# Patient Record
Sex: Female | Born: 1985 | Race: White | Hispanic: No | Smoking: Never smoker
Health system: Southern US, Community
[De-identification: ages and names within clinical notes are randomized; demographics above are authoritative.]

## PROBLEM LIST (undated history)

## (undated) ENCOUNTER — Emergency Department (HOSPITAL_COMMUNITY): Admission: EM | Payer: Medicaid Other | Source: Home / Self Care

## (undated) DIAGNOSIS — R519 Headache, unspecified: Secondary | ICD-10-CM

## (undated) DIAGNOSIS — K219 Gastro-esophageal reflux disease without esophagitis: Secondary | ICD-10-CM

## (undated) DIAGNOSIS — R569 Unspecified convulsions: Secondary | ICD-10-CM

## (undated) DIAGNOSIS — F419 Anxiety disorder, unspecified: Secondary | ICD-10-CM

## (undated) DIAGNOSIS — E559 Vitamin D deficiency, unspecified: Secondary | ICD-10-CM

## (undated) HISTORY — PX: WISDOM TOOTH EXTRACTION: SHX21

## (undated) HISTORY — DX: Vitamin D deficiency, unspecified: E55.9

---

## 2016-11-09 ENCOUNTER — Emergency Department
Admission: EM | Admit: 2016-11-09 | Discharge: 2016-11-09 | Disposition: A | Discharge status: Home / Self Care | Payer: Medicaid Other | Attending: Emergency Medicine | Admitting: Emergency Medicine

## 2016-11-09 ENCOUNTER — Encounter: Payer: Self-pay | Admitting: Emergency Medicine

## 2016-11-09 ENCOUNTER — Emergency Department: Payer: Medicaid Other

## 2016-11-09 DIAGNOSIS — O2311 Infections of bladder in pregnancy, first trimester: Secondary | ICD-10-CM | POA: Insufficient documentation

## 2016-11-09 DIAGNOSIS — N3000 Acute cystitis without hematuria: Secondary | ICD-10-CM

## 2016-11-09 DIAGNOSIS — N9489 Other specified conditions associated with female genital organs and menstrual cycle: Secondary | ICD-10-CM | POA: Diagnosis not present

## 2016-11-09 DIAGNOSIS — O26891 Other specified pregnancy related conditions, first trimester: Secondary | ICD-10-CM

## 2016-11-09 DIAGNOSIS — R109 Unspecified abdominal pain: Secondary | ICD-10-CM

## 2016-11-09 DIAGNOSIS — Z3A01 Less than 8 weeks gestation of pregnancy: Secondary | ICD-10-CM | POA: Diagnosis not present

## 2016-11-09 DIAGNOSIS — R52 Pain, unspecified: Secondary | ICD-10-CM

## 2016-11-09 LAB — CBC
HCT: 40.4 % (ref 35.0–47.0)
Hemoglobin: 13.9 g/dL (ref 12.0–16.0)
MCH: 30.1 pg (ref 26.0–34.0)
MCHC: 34.5 g/dL (ref 32.0–36.0)
MCV: 87.3 fL (ref 80.0–100.0)
Platelets: 258 10*3/uL (ref 150–440)
RBC: 4.63 MIL/uL (ref 3.80–5.20)
RDW: 13.1 % (ref 11.5–14.5)
WBC: 6.5 10*3/uL (ref 3.6–11.0)

## 2016-11-09 LAB — COMPREHENSIVE METABOLIC PANEL
ALT: 37 U/L (ref 14–54)
AST: 32 U/L (ref 15–41)
Albumin: 4.7 g/dL (ref 3.5–5.0)
Alkaline Phosphatase: 49 U/L (ref 38–126)
Anion gap: 8 (ref 5–15)
BUN: 8 mg/dL (ref 6–20)
CO2: 25 mmol/L (ref 22–32)
Calcium: 9.5 mg/dL (ref 8.9–10.3)
Chloride: 103 mmol/L (ref 101–111)
Creatinine, Ser: 0.58 mg/dL (ref 0.44–1.00)
GFR calc Af Amer: >60 mL/min (ref >60)
GFR calc non Af Amer: >60 mL/min (ref >60)
Glucose, Bld: 125 mg/dL — ABNORMAL HIGH (ref 65–99)
Potassium: 3.3 mmol/L — ABNORMAL LOW (ref 3.5–5.1)
Sodium: 136 mmol/L (ref 135–145)
Total Bilirubin: 0.4 mg/dL (ref 0.3–1.2)
Total Protein: 7.7 g/dL (ref 6.5–8.1)

## 2016-11-09 LAB — URINALYSIS, COMPLETE (UACMP) WITH MICROSCOPIC
Bacteria, UA: NONE SEEN
Bilirubin Urine: NEGATIVE
Glucose, UA: NEGATIVE mg/dL
Hgb urine dipstick: NEGATIVE
Ketones, ur: 5 mg/dL — AB
Nitrite: NEGATIVE
Protein, ur: NEGATIVE mg/dL
Specific Gravity, Urine: 1.005 (ref 1.005–1.030)
pH: 6 (ref 5.0–8.0)

## 2016-11-09 LAB — ABO/RH: ABO/RH(D): AB POS

## 2016-11-09 LAB — LIPASE, BLOOD: Lipase: 28 U/L (ref 11–51)

## 2016-11-09 LAB — POCT PREGNANCY, URINE: Preg Test, Ur: POSITIVE — AB

## 2016-11-09 LAB — HCG, QUANTITATIVE, PREGNANCY: hCG, Beta Chain, Quant, S: 4822 m[IU]/mL — ABNORMAL HIGH (ref <5)

## 2016-11-09 MED ORDER — NITROFURANTOIN MONOHYD MACRO 100 MG PO CAPS: 100.0000 mg | ORAL_CAPSULE | Freq: Two times a day (BID) | ORAL | 0 refills | Status: DC

## 2016-11-09 MED ORDER — ONDANSETRON 4 MG PO TBDP: 4.0000 mg | ORAL_TABLET | Freq: Three times a day (TID) | ORAL | 0 refills | Status: DC | PRN

## 2016-11-09 NOTE — ED Notes (Signed)
Lurena JoinerRebecca in lab to add Hcg beta and ABO type to blood already in lab

## 2016-11-09 NOTE — ED Provider Notes (Signed)
[facility] Emergency Department Provider Note       Time seen: ----------------------------------------- 5:01 PM on 11/09/2016 -----------------------------------------     I have reviewed the triage vital signs and the nursing notes.   HISTORY   Chief Complaint Abdominal Pain    HPI Carolyn Lynch is a 31 y.o. female who presents to the ED for periumbilical abdominal pain. Patient states the pain started earlier today. Yesterday she had pain in the left lower side of the right lower side. She had a positive home pregnancy test a days ago, has not had OB/GYN follow-up. She denies any vaginal bleeding or leakage of fluid. Pain is currently 8 out of 10.   History reviewed. No pertinent past medical history.  There are no active problems to display for this patient.   History reviewed. No pertinent surgical history.  Allergies Patient has no known allergies.  Social History Social History  Substance Use Topics  . Smoking status: Never Smoker  . Smokeless tobacco: Never Used  . Alcohol use No    Review of Systems Constitutional: Negative for fever. Eyes: Negative for vision changes ENT:  Negative for congestion, sore throat Cardiovascular: Negative for chest pain. Respiratory: Negative for shortness of breath. Gastrointestinal: Positive for abdominal pain Genitourinary: Negative for dysuria. Musculoskeletal: Negative for back pain. Skin: Negative for rash. Neurological: Negative for headaches, focal weakness or numbness.  All systems negative/normal/unremarkable except as stated in the HPI  ____________________________________________   PHYSICAL EXAM:  VITAL SIGNS: ED Triage Vitals  Enc Vitals Group     BP 11/09/16 1620 128/73     Pulse Rate 11/09/16 1620 100     Resp 11/09/16 1620 16     Temp 11/09/16 1620 98.8 F (37.1 C)     Temp Source 11/09/16 1620 Oral     SpO2 11/09/16 1620 99 %     Weight --      Height --      Head  Circumference --      Peak Flow --      Pain Score 11/09/16 1616 8     Pain Loc --      Pain Edu? --      Excl. in GC? --     Constitutional: Alert and oriented. Well appearing and in no distress. Eyes: Conjunctivae are normal. PERRL. Normal extraocular movements. ENT   Head: Normocephalic and atraumatic.   Nose: No congestion/rhinnorhea.   Mouth/Throat: Mucous membranes are moist.   Neck: No stridor. Cardiovascular: Normal rate, regular rhythm. No murmurs, rubs, or gallops. Respiratory: Normal respiratory effort without tachypnea nor retractions. Breath sounds are clear and equal bilaterally. No wheezes/rales/rhonchi. Gastrointestinal: Soft and nontender. Normal bowel sounds Musculoskeletal: Nontender with normal range of motion in extremities. No lower extremity tenderness nor edema. Neurologic:  Normal speech and language. No gross focal neurologic deficits are appreciated.  Skin:  Skin is warm, dry and intact. No rash noted. Psychiatric: Mood and affect are normal. Speech and behavior are normal.  ____________________________________________  ED COURSE:  Pertinent labs & imaging results that were available during my care of the patient were reviewed by me and considered in my medical decision making (see chart for details). Patient presents for abdominal pain, we will assess with labs and imaging as indicated.   Procedures ____________________________________________   LABS (pertinent positives/negatives)  Labs Reviewed  COMPREHENSIVE METABOLIC PANEL - Abnormal; Notable for the following:       Result Value   Potassium 3.3 (*)  Glucose, Bld 125 (*)    All other components within normal limits  URINALYSIS, COMPLETE (UACMP) WITH MICROSCOPIC - Abnormal; Notable for the following:    Color, Urine STRAW (*)    APPearance HAZY (*)    Ketones, ur 5 (*)    Leukocytes, UA LARGE (*)    Squamous Epithelial / LPF 0-5 (*)    All other components within normal limits   POCT PREGNANCY, URINE - Abnormal; Notable for the following:    Preg Test, Ur POSITIVE (*)    All other components within normal limits  LIPASE, BLOOD  CBC  HCG, QUANTITATIVE, PREGNANCY  POC URINE PREG, ED  ABO/RH    RADIOLOGY  Pregnancy ultrasound IMPRESSION: 1. Single intrauterine gestational sac containing a yolk sac but no visible embryo at this time. Probable early intrauterine gestational sac, but no fetal pole or cardiac activity yet visualized. Recommend follow-up quantitative B-HCG levels and follow-up US in 14 days to assess viability. This recommendation follows SRU consensus guidelines: Diagnostic Criteria for Nonviable Pregnancy Early in the First Trimester. Malva Limes Engl J Med 2013; 784:6962-95; 369:1443-51. 2. Small amount of free pelvic fluid in the cul-de-sac, nonspecific. 3. Probable corpus luteum cyst of the right ovary.   ____________________________________________  FINAL ASSESSMENT AND PLAN  Abdominal pain and pregnancy  Plan: Patient's labs and imaging were dictated above. Patient had presented for abdominal pain in early pregnancy. Ultrasound is likely indicative of early intrauterine gestation. She will need follow-up ultrasound in 14 days. Unclear etiology for her mid abdominal pain. She does have evidence of UTI will be treated for same. She is stable for discharge.   Emily FilbertWilliams, Jamiria Langill E, MD   Note: This note was generated in part or whole with voice recognition software. Voice recognition is usually quite accurate but there are transcription errors that can and very often do occur. I apologize for any typographical errors that were not detected and corrected.     Emily FilbertWilliams, Samaria Anes E, MD 11/09/16 [id number] [id number]

## 2016-11-09 NOTE — ED Triage Notes (Signed)
Pt to ED c/o periumbilical abdominal pain. Pt states that the pain started earlier today. Yesterday she had pain in the left lower side and right lower side. Pt states that she had a positive home pregnancy test 8 days ago. Has not seen OBGYN. Pt denies vaginal bleeding.

## 2016-11-21 ENCOUNTER — Ambulatory Visit (INDEPENDENT_AMBULATORY_CARE_PROVIDER_SITE_OTHER): Payer: Medicaid Other | Admitting: Obstetrics and Gynecology

## 2016-11-21 ENCOUNTER — Encounter: Payer: Self-pay | Admitting: Obstetrics and Gynecology

## 2016-11-21 VITALS — BP 127/75 | HR 87 | Ht 64.75 in | Wt 182.0 lb

## 2016-11-21 DIAGNOSIS — N912 Amenorrhea, unspecified: Secondary | ICD-10-CM | POA: Diagnosis not present

## 2016-11-21 DIAGNOSIS — O2341 Unspecified infection of urinary tract in pregnancy, first trimester: Secondary | ICD-10-CM

## 2016-11-21 LAB — POCT URINALYSIS DIPSTICK
Bilirubin, UA: NEGATIVE
Blood, UA: NEGATIVE
Glucose, UA: NEGATIVE
Ketones, UA: NEGATIVE
Leukocytes, UA: NEGATIVE
Nitrite, UA: NEGATIVE
Protein, UA: NEGATIVE
Spec Grav, UA: 1.015 (ref 1.010–1.025)
Urobilinogen, UA: 0.2 E.U./dL
pH, UA: 7 (ref 5.0–8.0)

## 2016-11-21 LAB — POCT URINE PREGNANCY: Preg Test, Ur: POSITIVE — AB

## 2016-11-21 NOTE — Progress Notes (Signed)
HPI:      [name] is a 31 y.o. G1P0 who LMP was Patient's last menstrual period was 10/04/2016 (approximate).  Subjective:   She presents today With complaint of amenorrhea. She was seen in the emergency department and diagnosed with pelvic pain and urinary tract infection. At that time she had a positive principal test. An ultrasound revealed a gestational sac without fetal pole. Her abdominal pain has since resolved. She has occasional nausea without vomiting. She reports no other problems. She is not taking prenatal vitamins.    Hx: The following portions of the patient's history were reviewed and updated as appropriate:             She  has no past medical history on file. She  does not have a problem list on file. She  has no past surgical history on file. Her family history includes Cancer in her paternal grandfather and paternal grandmother; Diabetes in her mother; Hyperlipidemia in her mother; Hypertension in her father; Stroke in her mother. She  reports that she has never smoked. She has never used smokeless tobacco. She reports that she does not drink alcohol or use drugs. She has No Known Allergies.       Review of Systems:  Review of Systems  Constitutional: Denied constitutional symptoms, night sweats, recent illness, fatigue, fever, insomnia and weight loss.  Eyes: Denied eye symptoms, eye pain, photophobia, vision change and visual disturbance.  Ears/Nose/Throat/Neck: Denied ear, nose, throat or neck symptoms, hearing loss, nasal discharge, sinus congestion and sore throat.  Cardiovascular: Denied cardiovascular symptoms, arrhythmia, chest pain/pressure, edema, exercise intolerance, orthopnea and palpitations.  Respiratory: Denied pulmonary symptoms, asthma, pleuritic pain, productive sputum, cough, dyspnea and wheezing.  Gastrointestinal: Denied, gastro-esophageal reflux, melena, nausea and vomiting.  Genitourinary: Denied genitourinary symptoms including  symptomatic vaginal discharge, pelvic relaxation issues, and urinary complaints.  Musculoskeletal: Denied musculoskeletal symptoms, stiffness, swelling, muscle weakness and myalgia.  Dermatologic: Denied dermatology symptoms, rash and scar.  Neurologic: Denied neurology symptoms, dizziness, headache, neck pain and syncope.  Psychiatric: Denied psychiatric symptoms, anxiety and depression.  Endocrine: Denied endocrine symptoms including hot flashes and night sweats.   Meds:   Current Outpatient Prescriptions on File Prior to Visit  Medication Sig Dispense Refill  . nitrofurantoin, macrocrystal-monohydrate, (MACROBID) 100 MG capsule Take 1 capsule (100 mg total) by mouth 2 (two) times daily. 20 capsule 0  . ondansetron (ZOFRAN ODT) 4 MG disintegrating tablet Take 1 tablet (4 mg total) by mouth every 8 (eight) hours as needed for nausea or vomiting. 20 tablet 0   No current facility-administered medications on file prior to visit.     Objective:     Vitals:   11/21/16 0816  BP: 127/75  Pulse: 87                Assessment:    G1P0 There are no active problems to display for this patient.    1. Amenorrhea   2. UTI (urinary tract infection) during pregnancy, first trimester     Patient still taking antibiotics for UTI but symptoms dramatically improved.  Early pregnant patient with nausea but no vomiting.  Gestational sac without fetal pole.   Plan:            Prenatal Plan 1.  The patient was given prenatal literature. 2.  She was begun on prenatal vitamins. 3.  A prenatal lab panel was ordered or drawn. 4.  An ultrasound was ordered to as a follow-up to the empty  gestational sac 5.  A nurse visit was scheduled.   Orders Orders Placed This Encounter  Procedures  . US OB Follow Up  . POCT urine pregnancy  . POCT urinalysis dipstick    No orders of the defined types were placed in this encounter.       F/U  No Follow-up on file. I spent 31 minutes with this  patient of which greater than 50% was spent discussing emergency department visit, follow-up ultrasound, future prenatal care, nausea and vomiting of pregnancy.  Elonda Huskyavid J. Rana Adorno, M.D. 11/21/2016 9:37 AM

## 2016-11-29 ENCOUNTER — Ambulatory Visit (INDEPENDENT_AMBULATORY_CARE_PROVIDER_SITE_OTHER): Payer: Medicaid Other

## 2016-11-29 ENCOUNTER — Other Ambulatory Visit: Payer: Self-pay | Admitting: Obstetrics and Gynecology

## 2016-11-29 DIAGNOSIS — O2341 Unspecified infection of urinary tract in pregnancy, first trimester: Secondary | ICD-10-CM | POA: Diagnosis not present

## 2016-11-29 DIAGNOSIS — N912 Amenorrhea, unspecified: Secondary | ICD-10-CM | POA: Diagnosis not present

## 2016-11-30 ENCOUNTER — Encounter: Payer: Self-pay | Admitting: Emergency Medicine

## 2016-11-30 DIAGNOSIS — Z3A08 8 weeks gestation of pregnancy: Secondary | ICD-10-CM | POA: Diagnosis not present

## 2016-11-30 DIAGNOSIS — O209 Hemorrhage in early pregnancy, unspecified: Secondary | ICD-10-CM | POA: Insufficient documentation

## 2016-11-30 LAB — CBC WITH DIFFERENTIAL/PLATELET
Basophils Absolute: 0 10*3/uL (ref 0–0.1)
Basophils Relative: 0 %
Eosinophils Absolute: 0.1 10*3/uL (ref 0–0.7)
Eosinophils Relative: 1 %
HCT: 38.7 % (ref 35.0–47.0)
Hemoglobin: 13.5 g/dL (ref 12.0–16.0)
Lymphocytes Relative: 27 %
Lymphs Abs: 2.2 10*3/uL (ref 1.0–3.6)
MCH: 30.5 pg (ref 26.0–34.0)
MCHC: 35 g/dL (ref 32.0–36.0)
MCV: 87 fL (ref 80.0–100.0)
Monocytes Absolute: 0.5 10*3/uL (ref 0.2–0.9)
Monocytes Relative: 6 %
Neutro Abs: 5.4 10*3/uL (ref 1.4–6.5)
Neutrophils Relative %: 66 %
Platelets: 234 10*3/uL (ref 150–440)
RBC: 4.45 MIL/uL (ref 3.80–5.20)
RDW: 13.1 % (ref 11.5–14.5)
WBC: 8.3 10*3/uL (ref 3.6–11.0)

## 2016-11-30 LAB — ABO/RH: ABO/RH(D): AB POS

## 2016-11-30 LAB — HCG, QUANTITATIVE, PREGNANCY: hCG, Beta Chain, Quant, S: 142199 m[IU]/mL — ABNORMAL HIGH (ref <5)

## 2016-11-30 NOTE — ED Triage Notes (Signed)
Patient states that she is [redacted] weeks pregnant. Patient states that tonight when she went to the bathroom she had moderate amount of brown blood.

## 2016-12-01 ENCOUNTER — Emergency Department: Payer: Medicaid Other

## 2016-12-01 ENCOUNTER — Emergency Department
Admission: EM | Admit: 2016-12-01 | Discharge: 2016-12-01 | Disposition: A | Discharge status: Home / Self Care | Payer: Medicaid Other | Attending: Emergency Medicine | Admitting: Emergency Medicine

## 2016-12-01 DIAGNOSIS — O209 Hemorrhage in early pregnancy, unspecified: Secondary | ICD-10-CM

## 2016-12-01 NOTE — Discharge Instructions (Signed)
Please follow up closely with obstetrics and gynecology or your primary doctor.  Return to the emergency room if your bleeding worsens, you become weak and dizzy or lightheaded, you have an episode of passing out, develop severe bleeding such as more than 1 soaked pad per hour for more than 3 straight hours, develop abdominal or pelvic pain, fevers chills or other new concerns arise.   

## 2016-12-01 NOTE — ED Notes (Signed)
Pt discharged to home.  Family member driving.  Discharge instructions reviewed.  Verbalized understanding.  No questions or concerns at this time.  Teach back verified.  Pt in NAD.  No items left in ED.   

## 2016-12-01 NOTE — ED Provider Notes (Signed)
[facility] Emergency Department Provider Note   ____________________________________________   First MD Initiated Contact with Patient 12/01/16 [phone]     (approximate)  I have reviewed the triage vital signs and the nursing notes.   HISTORY  Chief Complaint Vaginal Bleeding   HPI Carolyn Lynch is a 31 y.o. female here for evaluation of vaginal bleeding noticed this evening  Patient was watching a movie,she started to use the bathroom and noticed what she describes as a small amount of somewhat dark blood. She only had it occur once, but reports it seems similar to the amount of blood that she would see it start of a menstrual cycle. Not associated with any pain or cramps. No nausea or vomiting. No trouble breathing. She reports that she has not had any further bleeding, and she just checked and she has not seen any evidence bleeding ongoing  She is [redacted] weeks pregnant. Follows with in [facility] no previous pregnancies   History reviewed. No pertinent past medical history.  There are no active problems to display for this patient.   History reviewed. No pertinent surgical history.  Prior to Admission medications   Medication Sig Start Date End Date Taking? Authorizing Provider  nitrofurantoin, macrocrystal-monohydrate, (MACROBID) 100 MG capsule Take 1 capsule (100 mg total) by mouth 2 (two) times daily. 11/09/16   Emily Filbert, MD  ondansetron (ZOFRAN ODT) 4 MG disintegrating tablet Take 1 tablet (4 mg total) by mouth every 8 (eight) hours as needed for nausea or vomiting. 11/09/16   Emily Filbert, MD    Allergies Patient has no known allergies.  Family History  Problem Relation Age of Onset  . Diabetes Mother   . Hyperlipidemia Mother   . Stroke Mother   . Hypertension Father   . Cancer Paternal Grandmother   . Cancer Paternal Grandfather     Social History Social History  Substance Use Topics  . Smoking status: Never  Smoker  . Smokeless tobacco: Never Used  . Alcohol use No    Review of Systems Constitutional: No fever/chills Eyes: No visual changes. ENT: No sore throat. Cardiovascular: Denies chest pain. Respiratory: Denies shortness of breath. Gastrointestinal: No abdominal pain.  No nausea, no vomiting.  No diarrhea.  No constipation. Genitourinary: Negative for dysuria.See history of present illness. No difficulty with urination or pain or burning. Musculoskeletal: Negative for back pain. Skin: Negative for rash. Neurological: Negative for headaches    ____________________________________________   PHYSICAL EXAM:  VITAL SIGNS: ED Triage Vitals  Enc Vitals Group     BP 11/30/16 2244 114/73     Pulse Rate 11/30/16 2244 87     Resp 11/30/16 2244 18     Temp 11/30/16 2244 98.3 F (36.8 C)     Temp Source 11/30/16 2244 Oral     SpO2 11/30/16 2244 97 %     Weight 11/30/16 2244 182 lb (82.6 kg)     Height 11/30/16 2244 5\' 4"  (1.626 m)     Head Circumference --      Peak Flow --      Pain Score 11/30/16 2243 0     Pain Loc --      Pain Edu? --      Excl. in GC? --     Constitutional: Alert and oriented. Well appearing and in no acute distress. Eyes: Conjunctivae are normal. Head: Atraumatic. Nose: No congestion/rhinnorhea. Mouth/Throat: Mucous membranes are moist. Neck: No stridor.   Cardiovascular: Normal rate,  regular rhythm. Grossly normal heart sounds.  Good peripheral circulation. Respiratory: Normal respiratory effort.  No retractions. Lungs CTAB. Gastrointestinal: Soft and nontender. No distention. Not palpably gravid. Musculoskeletal: No lower extremity tenderness nor edema. Neurologic:  Normal speech and language. No gross focal neurologic deficits are appreciated.  Skin:  Skin is warm, dry and intact. No rash noted. Psychiatric: Mood and affect are normal. Speech and behavior are normal.  ____________________________________________   LABS (all labs ordered are  listed, but only abnormal results are displayed)  Labs Reviewed  HCG, QUANTITATIVE, PREGNANCY - Abnormal; Notable for the following:       Result Value   hCG, Beta Chain, Quant, S 142,199 (*)    All other components within normal limits  CBC WITH DIFFERENTIAL/PLATELET  ABO/RH   ____________________________________________  EKG   ____________________________________________  RADIOLOGY  [place] Ob Comp < 14 Wks  Result Date: 12/01/2016 CLINICAL DATA:  Vaginal bleeding EXAM: OBSTETRIC <14 WK US AND TRANSVAGINAL OB US TECHNIQUE: Both transabdominal and transvaginal ultrasound examinations were performed for complete evaluation of the gestation as well as the maternal uterus, adnexal regions, and pelvic cul-de-sac. Transvaginal technique was performed to assess early pregnancy. COMPARISON:  11/29/2016 FINDINGS: Intrauterine gestational sac: Single Yolk sac:  Visualized Embryo:  Visualized Cardiac Activity: Visualized Heart Rate: 163  bpm CRL:  17.9  mm   8 w   2 d                  US EDC: 07/11/2017 Subchorionic hemorrhage:  None visualized. Maternal uterus/adnexae: Normal appearing uterus and ovaries. IMPRESSION: 1. Single live intrauterine 8 week 2 day gestation. 2. No subchorionic hemorrhage. Electronically Signed   By: Tollie Ethavid  Kwon M.D.   On: 12/01/2016 02:09   [place] Ob Transvaginal  Result Date: 12/01/2016 CLINICAL DATA:  Vaginal bleeding EXAM: OBSTETRIC <14 WK US AND TRANSVAGINAL OB US TECHNIQUE: Both transabdominal and transvaginal ultrasound examinations were performed for complete evaluation of the gestation as well as the maternal uterus, adnexal regions, and pelvic cul-de-sac. Transvaginal technique was performed to assess early pregnancy. COMPARISON:  11/29/2016 FINDINGS: Intrauterine gestational sac: Single Yolk sac:  Visualized Embryo:  Visualized Cardiac Activity: Visualized Heart Rate: 163  bpm CRL:  17.9  mm   8 w   2 d                  US EDC: 07/11/2017 Subchorionic hemorrhage:  None  visualized. Maternal uterus/adnexae: Normal appearing uterus and ovaries. IMPRESSION: 1. Single live intrauterine 8 week 2 day gestation. 2. No subchorionic hemorrhage. Electronically Signed   By: Tollie Ethavid  Kwon M.D.   On: 12/01/2016 02:09    ____________________________________________   PROCEDURES  Procedure(s) performed: None  Procedures  Critical Care performed: No  ____________________________________________   INITIAL IMPRESSION / ASSESSMENT AND PLAN / ED COURSE  Pertinent labs & imaging results that were available during my care of the patient were reviewed by me and considered in my medical decision making (see chart for details).  Painless vaginal bleeding, mild. Now apparently resolved. Positive pregnancy, previously confirmed IUP here in [facility]. Hemodynamically stable. Nontoxic. No infectious symptoms.  ----------------------------------------- 3:15 AM on 12/01/2016 -----------------------------------------  No ongoing pain or discomfort. Rh+.  Discussed with patient, careful return precautions and she will call and schedule follow-up care with [facility] on Monday.      ____________________________________________   FINAL CLINICAL IMPRESSION(S) / ED DIAGNOSES  Final diagnoses:  Vaginal bleeding in pregnancy, first trimester      NEW MEDICATIONS STARTED  DURING THIS VISIT:  New Prescriptions   No medications on file     Note:  This document was prepared using Dragon voice recognition software and may include unintentional dictation errors.     Sharyn Creamer, MD 12/01/16 [phone]

## 2016-12-06 ENCOUNTER — Ambulatory Visit (INDEPENDENT_AMBULATORY_CARE_PROVIDER_SITE_OTHER): Payer: Medicaid Other | Admitting: Obstetrics and Gynecology

## 2016-12-06 ENCOUNTER — Encounter: Payer: Self-pay | Admitting: Obstetrics and Gynecology

## 2016-12-06 VITALS — BP 112/67 | HR 88 | Ht 64.0 in | Wt 180.1 lb

## 2016-12-06 DIAGNOSIS — O200 Threatened abortion: Secondary | ICD-10-CM

## 2016-12-06 LAB — POCT URINALYSIS DIPSTICK
Bilirubin, UA: NEGATIVE
Blood, UA: NEGATIVE
Glucose, UA: NEGATIVE
Ketones, UA: NEGATIVE
Leukocytes, UA: NEGATIVE
Nitrite, UA: NEGATIVE
Protein, UA: NEGATIVE
Spec Grav, UA: 1.015 (ref 1.010–1.025)
Urobilinogen, UA: 0.2 E.U./dL
pH, UA: 5 (ref 5.0–8.0)

## 2016-12-06 NOTE — Progress Notes (Signed)
HPI:      [name] is a 31 y.o. G1P0 who LMP was Patient's last menstrual period was 10/04/2016 (approximate).  Subjective:   She presents today After being seen in emergency department for vaginal bleeding. Ultrasound at that time revealed a normal intrauterine pregnancy. There was no evidence of subchorionic hemorrhage. Her bleeding has since resolved. She has no complaints at this time.    Hx: The following portions of the patient's history were reviewed and updated as appropriate:             She  has no past medical history on file. She  does not have a problem list on file. She  has no past surgical history on file. Her family history includes Cancer in her paternal grandfather and paternal grandmother; Diabetes in her mother; Hyperlipidemia in her mother; Hypertension in her father; Stroke in her mother. She  reports that she has never smoked. She has never used smokeless tobacco. She reports that she does not drink alcohol or use drugs. She has No Known Allergies.       Review of Systems:  Review of Systems  Constitutional: Denied constitutional symptoms, night sweats, recent illness, fatigue, fever, insomnia and weight loss.  Eyes: Denied eye symptoms, eye pain, photophobia, vision change and visual disturbance.  Ears/Nose/Throat/Neck: Denied ear, nose, throat or neck symptoms, hearing loss, nasal discharge, sinus congestion and sore throat.  Cardiovascular: Denied cardiovascular symptoms, arrhythmia, chest pain/pressure, edema, exercise intolerance, orthopnea and palpitations.  Respiratory: Denied pulmonary symptoms, asthma, pleuritic pain, productive sputum, cough, dyspnea and wheezing.  Gastrointestinal: Denied, gastro-esophageal reflux, melena, nausea and vomiting.  Genitourinary: Denied genitourinary symptoms including symptomatic vaginal discharge, pelvic relaxation issues, and urinary complaints.  Musculoskeletal: Denied musculoskeletal symptoms, stiffness, swelling,  muscle weakness and myalgia.  Dermatologic: Denied dermatology symptoms, rash and scar.  Neurologic: Denied neurology symptoms, dizziness, headache, neck pain and syncope.  Psychiatric: Denied psychiatric symptoms, anxiety and depression.  Endocrine: Denied endocrine symptoms including hot flashes and night sweats.   Meds:   Current Outpatient Prescriptions on File Prior to Visit  Medication Sig Dispense Refill  . nitrofurantoin, macrocrystal-monohydrate, (MACROBID) 100 MG capsule Take 1 capsule (100 mg total) by mouth 2 (two) times daily. (Patient not taking: Reported on 12/06/2016) 20 capsule 0  . ondansetron (ZOFRAN ODT) 4 MG disintegrating tablet Take 1 tablet (4 mg total) by mouth every 8 (eight) hours as needed for nausea or vomiting. (Patient not taking: Reported on 12/06/2016) 20 tablet 0   No current facility-administered medications on file prior to visit.     Objective:     Vitals:   12/06/16 0829  BP: 112/67  Pulse: 88              Ultrasound results reviewed directly with the patient  Assessment:    G1P0    1. Threatened abortion in first trimester     No further vaginal bleeding since emergency department visit   Plan:            1.  Continue prenatal care. Nurse visit tomorrow. Follow-up new OB physical as scheduled. Orders    Meds ordered this encounter  Medications  . Prenatal Vit-Fe Fumarate-FA (PRENATAL MULTIVITAMIN) TABS tablet    Sig: Take 1 tablet by mouth daily at 12 noon.        F/U  Return for Next Scheduled Follow-up. I spent 15 minutes with this patient of which greater than 50% was spent discussing vaginal bleeding in early pregnancy, threatened  miscarriage, OB care and follow-up.  Elonda Huskyavid J. Cynethia Schindler, M.D. 12/06/2016 9:19 AM

## 2016-12-07 ENCOUNTER — Ambulatory Visit (INDEPENDENT_AMBULATORY_CARE_PROVIDER_SITE_OTHER): Payer: Medicaid Other | Admitting: Certified Nurse Midwife

## 2016-12-07 VITALS — BP 101/70 | HR 93 | Ht 64.0 in | Wt 180.3 lb

## 2016-12-07 DIAGNOSIS — Z1389 Encounter for screening for other disorder: Secondary | ICD-10-CM

## 2016-12-07 DIAGNOSIS — Z113 Encounter for screening for infections with a predominantly sexual mode of transmission: Secondary | ICD-10-CM

## 2016-12-07 DIAGNOSIS — Z3401 Encounter for supervision of normal first pregnancy, first trimester: Secondary | ICD-10-CM

## 2016-12-07 DIAGNOSIS — E669 Obesity, unspecified: Secondary | ICD-10-CM

## 2016-12-07 DIAGNOSIS — E559 Vitamin D deficiency, unspecified: Secondary | ICD-10-CM

## 2016-12-07 NOTE — Progress Notes (Signed)
Carolyn Lynch Agent presents for NOB nurse interview visit. [facility], confirmed with an ultrasound on 12/01/2016. EDD: 07/11/2016.   Saw Dr. Logan BoresEvans on 12/06/2016 for TAB. G-1.  P-0. Pregnancy education material explained and given. No cats in the home. NOB labs ordered. TSH/HbgA1c due to Increased BMI-31.  HIV labs and Drug screen were explained optional and she did not decline. Drug screen ordered. Pt states she has a Vitamin D Deficiency and used to take Vitamin D but would forget at times. Vitamin D lab ordered. Pt may try Vitamin B6 3x day and Unisom at bedtime if needed for nausea.  PNV encouraged. Genetic screening options discussed. Genetic testing: Unsure.  Pt may discuss with provider. Pt. To follow up with provider as scheduled on 01/02/2017 for NOB physical.  All questions answered. Pt stated that her mother had twins that died in utero and they had to take them.

## 2016-12-07 NOTE — Patient Instructions (Signed)
Pregnancy and Zika Virus Disease Zika virus disease, or Zika, is an illness that can spread to people from mosquitoes that carry the virus. It may also spread from person to person through infected body fluids. Zika first occurred in [place], but recently it has spread to new areas. The virus occurs in tropical climates. The location of Zika continues to change. Most people who become infected with Zika virus do not develop serious illness. However, Zika may cause birth defects in an unborn baby whose mother is infected with the virus. It may also increase the risk of miscarriage. What are the symptoms of Zika virus disease? In many cases, people who have been infected with Zika virus do not develop any symptoms. If symptoms appear, they usually start about a week after the person is infected. Symptoms are usually mild. They may include:  Fever.  Rash.  Red eyes.  Joint pain.  How does Zika virus disease spread? The main way that Zika virus spreads is through the bite of a certain type of mosquito. Unlike most types of mosquitos, which bite only at night, the type of mosquito that carries Zika virus bites both at night and during the day. Zika virus can also spread through sexual contact, through a blood transfusion, and from a mother to her baby before or during birth. Once you have had Zika virus disease, it is unlikely that you will get it again. Can I pass Zika to my baby during pregnancy? Yes, Zika can pass from a mother to her baby before or during birth. What problems can Zika cause for my baby? A woman who is infected with Zika virus while pregnant is at risk of having her baby born with a condition in which the brain or head is smaller than expected (microcephaly). Babies who have microcephaly can have developmental delays, seizures, hearing problems, and vision problems. Having Zika virus disease during pregnancy can also increase the risk of miscarriage. How can Zika virus disease be  prevented? There is no vaccine to prevent Zika. The best way to prevent the disease is to avoid infected mosquitoes and avoid exposure to body fluids that can spread the virus. Avoid any possible exposure to Zika by taking the following precautions. For women and their sex partners:  Avoid traveling to high-risk areas. The locations where Zika is being reported change often. To identify high-risk areas, check the CDC travel website: www.cdc.gov/zika/geo/index.html  If you or your sex partner must travel to a high-risk area, talk with a health care provider before and after traveling.  Take all precautions to avoid mosquito bites if you live in, or travel to, any of the high-risk areas. Insect repellents are safe to use during pregnancy.  Ask your health care provider when it is safe to have sexual contact.  For women:  If you are pregnant or trying to become pregnant, avoid sexual contact with persons who may have been exposed to Zika virus, persons who have possible symptoms of Zika, or persons whose history you are unsure about. If you choose to have sexual contact with someone who may have been exposed to Zika virus, use condoms correctly during the entire duration of sexual activity, every time. Do not share sexual devices, as you may be exposed to body fluids.  Ask your health care provider about when it is safe to attempt pregnancy after a possible exposure to Zika virus.  What steps should I take to avoid mosquito bites? Take these steps to avoid mosquito bites   when you are in a high-risk area:  Wear loose clothing that covers your arms and legs.  Limit your outdoor activities.  Do not open windows unless they have window screens.  Sleep under mosquito nets.  Use insect repellent. The best insect repellents have:  DEET, picaridin, oil of lemon eucalyptus (OLE), or IR3535 in them.  Higher amounts of an active ingredient in them.  Remember that insect repellents are safe to  use during pregnancy.  Do not use OLE on children who are younger than 3 years of age. Do not use insect repellent on babies who are younger than 2 months of age.  Cover your child's stroller with mosquito netting. Make sure the netting fits snugly and that any loose netting does not cover your child's mouth or nose. Do not use a blanket as a mosquito-protection cover.  Do not apply insect repellent underneath clothing.  If you are using sunscreen, apply the sunscreen before applying the insect repellent.  Treat clothing with permethrin. Do not apply permethrin directly to your skin. Follow label directions for safe use.  Get rid of standing water, where mosquitoes may reproduce. Standing water is often found in items such as buckets, bowls, animal food dishes, and flowerpots.  When you return from traveling to any high-risk area, continue taking actions to protect yourself against mosquito bites for 3 weeks, even if you show no signs of illness. This will prevent spreading Zika virus to uninfected mosquitoes. What should I know about the sexual transmission of Zika? People can spread Zika to their sexual partners during vaginal, anal, or oral sex, or by sharing sexual devices. Many people with Zika do not develop symptoms, so a person could spread the disease without knowing that they are infected. The greatest risk is to women who are pregnant or who may become pregnant. Zika virus can live longer in semen than it can live in blood. Couples can prevent sexual transmission of the virus by:  Using condoms correctly during the entire duration of sexual activity, every time. This includes vaginal, anal, and oral sex.  Not sharing sexual devices. Sharing increases your risk of being exposed to body fluid from another person.  Avoiding all sexual activity until your health care provider says it is safe.  Should I be tested for Zika virus? A sample of your blood can be tested for Zika virus. A  pregnant woman should be tested if she may have been exposed to the virus or if she has symptoms of Zika. She may also have additional tests done during her pregnancy, such ultrasound testing. Talk with your health care provider about which tests are recommended. This information is not intended to replace advice given to you by your health care provider. Make sure you discuss any questions you have with your health care provider. Document Released: 03/02/2015 Document Revised: 11/17/2015 Document Reviewed: 02/23/2015 Elsevier Interactive Patient Education  2018 [employer] Hyperemesis Gravidarum Hyperemesis gravidarum is a severe form of nausea and vomiting that happens during pregnancy. Hyperemesis is worse than morning sickness. It may cause you to have nausea or vomiting all day for many days. It may keep you from eating and drinking enough food and liquids. Hyperemesis usually occurs during the first half (the first 20 weeks) of pregnancy. It often goes away once a woman is in her second half of pregnancy. However, sometimes hyperemesis continues through an entire pregnancy. What are the causes? The cause of this condition is not known. It may be related   to changes in chemicals (hormones) in the body during pregnancy, such as the high level of pregnancy hormone (human chorionic gonadotropin) or the increase in the female sex hormone (estrogen). What are the signs or symptoms? Symptoms of this condition include:  Severe nausea and vomiting.  Nausea that does not go away.  Vomiting that does not allow you to keep any food down.  Weight loss.  Body fluid loss (dehydration).  Having no desire to eat, or not liking food that you have previously enjoyed.  How is this diagnosed? This condition may be diagnosed based on:  A physical exam.  Your medical history.  Your symptoms.  Blood tests.  Urine tests.  How is this treated? This condition may be managed with medicine. If  medicines to do not help relieve nausea and vomiting, you may need to receive fluids through an IV tube at the hospital. Follow these instructions at home:  Take over-the-counter and prescription medicines only as told by your health care provider.  Avoid iron pills and multivitamins that contain iron for the first 3-4 months of pregnancy. If you take prescription iron pills, do not stop taking them unless your health care provider approves.  Take the following actions to help prevent nausea and vomiting: ? In the morning, before getting out of bed, try eating a couple of dry crackers or a piece of toast. ? Avoid foods and smells that upset your stomach. Fatty and spicy foods may make nausea worse. ? Eat 5-6 small meals a day. ? Do not drink fluids while eating meals. Drink between meals. ? Eat or suck on things that have ginger in them. Ginger can help relieve nausea. ? Avoid food preparation. The smell of food can spoil your appetite or trigger nausea.  Follow instructions from your health care provider about eating or drinking restrictions.  For snacks, eat high-protein foods, such as cheese.  Keep all follow-up and pre-birth (prenatal) visits as told by your health care provider. This is important. Contact a health care provider if:  You have pain in your abdomen.  You have a severe headache.  You have vision problems.  You are losing weight. Get help right away if:  You cannot drink fluids without vomiting.  You vomit blood.  You have constant nausea and vomiting.  You are very weak.  You are very thirsty.  You feel dizzy.  You faint.  You have a fever or other symptoms that last for more than 2-3 days.  You have a fever and your symptoms suddenly get worse. Summary  Hyperemesis gravidarum is a severe form of nausea and vomiting that happens during pregnancy.  Making some changes to your eating habits may help relieve nausea and vomiting.  This condition may  be managed with medicine.  If medicines to do not help relieve nausea and vomiting, you may need to receive fluids through an IV tube at the hospital. This information is not intended to replace advice given to you by your health care provider. Make sure you discuss any questions you have with your health care provider. Document Released: 06/11/2005 Document Revised: 02/08/2016 Document Reviewed: 02/08/2016 Elsevier Interactive Patient Education  2017 [employer] First Trimester of Pregnancy The first trimester of pregnancy is from week 1 until the end of week 13 (months 1 through 3). During this time, your baby will begin to develop inside you. At 6-8 weeks, the eyes and face are formed, and the heartbeat can be seen on ultrasound. At the   end of 12 weeks, all the baby's organs are formed. Prenatal care is all the medical care you receive before the birth of your baby. Make sure you get good prenatal care and follow all of your doctor's instructions. Follow these instructions at home: Medicines  Take over-the-counter and prescription medicines only as told by your doctor. Some medicines are safe and some medicines are not safe during pregnancy.  Take a prenatal vitamin that contains at least 600 micrograms (mcg) of folic acid.  If you have trouble pooping (constipation), take medicine that will make your stool soft (stool softener) if your doctor approves. Eating and drinking  Eat regular, healthy meals.  Your doctor will tell you the amount of weight gain that is right for you.  Avoid raw meat and uncooked cheese.  If you feel sick to your stomach (nauseous) or throw up (vomit): ? Eat 4 or 5 small meals a day instead of 3 large meals. ? Try eating a few soda crackers. ? Drink liquids between meals instead of during meals.  To prevent constipation: ? Eat foods that are high in fiber, like fresh fruits and vegetables, whole grains, and beans. ? Drink enough fluids to keep your pee  (urine) clear or pale yellow. Activity  Exercise only as told by your doctor. Stop exercising if you have cramps or pain in your lower belly (abdomen) or low back.  Do not exercise if it is too hot, too humid, or if you are in a place of great height (high altitude).  Try to avoid standing for long periods of time. Move your legs often if you must stand in one place for a long time.  Avoid heavy lifting.  Wear low-heeled shoes. Sit and stand up straight.  You can have sex unless your doctor tells you not to. Relieving pain and discomfort  Wear a good support bra if your breasts are sore.  Take warm water baths (sitz baths) to soothe pain or discomfort caused by hemorrhoids. Use hemorrhoid cream if your doctor says it is okay.  Rest with your legs raised if you have leg cramps or low back pain.  If you have puffy, bulging veins (varicose veins) in your legs: ? Wear support hose or compression stockings as told by your doctor. ? Raise (elevate) your feet for 15 minutes, 3-4 times a day. ? Limit salt in your food. Prenatal care  Schedule your prenatal visits by the twelfth week of pregnancy.  Write down your questions. Take them to your prenatal visits.  Keep all your prenatal visits as told by your doctor. This is important. Safety  Wear your seat belt at all times when driving.  Make a list of emergency phone numbers. The list should include numbers for family, friends, the hospital, and police and fire departments. General instructions  Ask your doctor for a referral to a local prenatal class. Begin classes no later than at the start of month 6 of your pregnancy.  Ask for help if you need counseling or if you need help with nutrition. Your doctor can give you advice or tell you where to go for help.  Do not use hot tubs, steam rooms, or saunas.  Do not douche or use tampons or scented sanitary pads.  Do not cross your legs for long periods of time.  Avoid all herbs  and alcohol. Avoid drugs that are not approved by your doctor.  Do not use any tobacco products, including cigarettes, chewing tobacco, and electronic cigarettes.   If you need help quitting, ask your doctor. You may get counseling or other support to help you quit.  Avoid cat litter boxes and soil used by cats. These carry germs that can cause birth defects in the baby and can cause a loss of your baby (miscarriage) or stillbirth.  Visit your dentist. At home, brush your teeth with a soft toothbrush. Be gentle when you floss. Contact a doctor if:  You are dizzy.  You have mild cramps or pressure in your lower belly.  You have a nagging pain in your belly area.  You continue to feel sick to your stomach, you throw up, or you have watery poop (diarrhea).  You have a bad smelling fluid coming from your vagina.  You have pain when you pee (urinate).  You have increased puffiness (swelling) in your face, hands, legs, or ankles. Get help right away if:  You have a fever.  You are leaking fluid from your vagina.  You have spotting or bleeding from your vagina.  You have very bad belly cramping or pain.  You gain or lose weight rapidly.  You throw up blood. It may look like coffee grounds.  You are around people who have German measles, fifth disease, or chickenpox.  You have a very bad headache.  You have shortness of breath.  You have any kind of trauma, such as from a fall or a car accident. Summary  The first trimester of pregnancy is from week 1 until the end of week 13 (months 1 through 3).  To take care of yourself and your unborn baby, you will need to eat healthy meals, take medicines only if your doctor tells you to do so, and do activities that are safe for you and your baby.  Keep all follow-up visits as told by your doctor. This is important as your doctor will have to ensure that your baby is healthy and growing well. This information is not intended to replace  advice given to you by your health care provider. Make sure you discuss any questions you have with your health care provider. Document Released: 11/28/2007 Document Revised: 06/19/2016 Document Reviewed: 06/19/2016 Elsevier Interactive Patient Education  2017 [employer] Commonly Asked Questions During Pregnancy  Cats: A parasite can be excreted in cat feces.  To avoid exposure you need to have another person empty the little box.  If you must empty the litter box you will need to wear gloves.  Wash your hands after handling your cat.  This parasite can also be found in raw or undercooked meat so this should also be avoided.  Colds, Sore Throats, Flu: Please check your medication sheet to see what you can take for symptoms.  If your symptoms are unrelieved by these medications please call the office.  Dental Work: Most any dental work your dentist recommends is permitted.  X-rays should only be taken during the first trimester if absolutely necessary.  Your abdomen should be shielded with a lead apron during all x-rays.  Please notify your provider prior to receiving any x-rays.  Novocaine is fine; gas is not recommended.  If your dentist requires a note from us prior to dental work please call the office and we will provide one for you.  Exercise: Exercise is an important part of staying healthy during your pregnancy.  You may continue most exercises you were accustomed to prior to pregnancy.  Later in your pregnancy you will most likely notice you have difficulty with activities   requiring balance like riding a bicycle.  It is important that you listen to your body and avoid activities that put you at a higher risk of falling.  Adequate rest and staying well hydrated are a must!  If you have questions about the safety of specific activities ask your provider.    Exposure to Children with illness: Try to avoid obvious exposure; report any symptoms to us when noted,  If you have chicken pos, red  measles or mumps, you should be immune to these diseases.   Please do not take any vaccines while pregnant unless you have checked with your OB provider.  Fetal Movement: After 28 weeks we recommend you do "kick counts" twice daily.  Lie or sit down in a calm quiet environment and count your baby movements "kicks".  You should feel your baby at least 10 times per hour.  If you have not felt 10 kicks within the first hour get up, walk around and have something sweet to eat or drink then repeat for an additional hour.  If count remains less than 10 per hour notify your provider.  Fumigating: Follow your pest control agent's advice as to how long to stay out of your home.  Ventilate the area well before re-entering.  Hemorrhoids:   Most over-the-counter preparations can be used during pregnancy.  Check your medication to see what is safe to use.  It is important to use a stool softener or fiber in your diet and to drink lots of liquids.  If hemorrhoids seem to be getting worse please call the office.   Hot Tubs:  Hot tubs Jacuzzis and saunas are not recommended while pregnant.  These increase your internal body temperature and should be avoided.  Intercourse:  Sexual intercourse is safe during pregnancy as long as you are comfortable, unless otherwise advised by your provider.  Spotting may occur after intercourse; report any bright red bleeding that is heavier than spotting.  Labor:  If you know that you are in labor, please go to the hospital.  If you are unsure, please call the office and let us help you decide what to do.  Lifting, straining, etc:  If your job requires heavy lifting or straining please check with your provider for any limitations.  Generally, you should not lift items heavier than that you can lift simply with your hands and arms (no back muscles)  Painting:  Paint fumes do not harm your pregnancy, but may make you ill and should be avoided if possible.  Latex or water based paints  have less odor than oils.  Use adequate ventilation while painting.  Permanents & Hair Color:  Chemicals in hair dyes are not recommended as they cause increase hair dryness which can increase hair loss during pregnancy.  " Highlighting" and permanents are allowed.  Dye may be absorbed differently and permanents may not hold as well during pregnancy.  Sunbathing:  Use a sunscreen, as skin burns easily during pregnancy.  Drink plenty of fluids; avoid over heating.  Tanning Beds:  Because their possible side effects are still unknown, tanning beds are not recommended.  Ultrasound Scans:  Routine ultrasounds are performed at approximately 20 weeks.  You will be able to see your baby's general anatomy an if you would like to know the gender this can usually be determined as well.  If it is questionable when you conceived you may also receive an ultrasound early in your pregnancy for dating purposes.  Otherwise ultrasound exams   are not routinely performed unless there is a medical necessity.  Although you can request a scan we ask that you pay for it when conducted because insurance does not cover " patient request" scans.  Work: If your pregnancy proceeds without complications you may work until your due date, unless your physician or employer advises otherwise.  Round Ligament Pain/Pelvic Discomfort:  Sharp, shooting pains not associated with bleeding are fairly common, usually occurring in the second trimester of pregnancy.  They tend to be worse when standing up or when you remain standing for long periods of time.  These are the result of pressure of certain pelvic ligaments called "round ligaments".  Rest, Tylenol and heat seem to be the most effective relief.  As the womb and fetus grow, they rise out of the pelvis and the discomfort improves.  Please notify the office if your pain seems different than that described.  It may represent a more serious condition.   

## 2016-12-07 NOTE — Progress Notes (Signed)
I have reviewed the record and concur with patient management and plan.    Jenkins Michelle Lawhorn, CNM [facility] 

## 2016-12-09 LAB — GC/CHLAMYDIA PROBE AMP
Chlamydia trachomatis, NAA: NEGATIVE
Neisseria gonorrhoeae by PCR: NEGATIVE

## 2016-12-09 LAB — URINE CULTURE, OB REFLEX: Organism ID, Bacteria: NO GROWTH

## 2016-12-09 LAB — CULTURE, OB URINE

## 2016-12-10 LAB — URINALYSIS, ROUTINE W REFLEX MICROSCOPIC
Bilirubin, UA: NEGATIVE
Glucose, UA: NEGATIVE
Nitrite, UA: NEGATIVE
Protein, UA: NEGATIVE
RBC, UA: NEGATIVE
Specific Gravity, UA: >=1.03 — AB (ref 1.005–1.030)
Urobilinogen, Ur: 1 mg/dL (ref 0.2–1.0)
pH, UA: 6.5 (ref 5.0–7.5)

## 2016-12-10 LAB — MONITOR DRUG PROFILE 14(MW)
Amphetamine Scrn, Ur: NEGATIVE ng/mL
BARBITURATE SCREEN URINE: NEGATIVE ng/mL
BENZODIAZEPINE SCREEN, URINE: NEGATIVE ng/mL
Buprenorphine, Urine: NEGATIVE ng/mL
CANNABINOIDS UR QL SCN: NEGATIVE ng/mL
Cocaine (Metab) Scrn, Ur: NEGATIVE ng/mL
Creatinine(Crt), U: 204.6 mg/dL (ref 20.0–300.0)
Fentanyl, Urine: NEGATIVE pg/mL
Meperidine Screen, Urine: NEGATIVE ng/mL
Methadone Screen, Urine: NEGATIVE ng/mL
OXYCODONE+OXYMORPHONE UR QL SCN: NEGATIVE ng/mL
Opiate Scrn, Ur: NEGATIVE ng/mL
Ph of Urine: 6.1 (ref 4.5–8.9)
Phencyclidine Qn, Ur: NEGATIVE ng/mL
Propoxyphene Scrn, Ur: NEGATIVE ng/mL
SPECIFIC GRAVITY: 1.027
Tramadol Screen, Urine: NEGATIVE ng/mL

## 2016-12-10 LAB — MICROSCOPIC EXAMINATION: Casts: NONE SEEN /lpf

## 2016-12-10 LAB — NICOTINE SCREEN, URINE: Cotinine Ql Scrn, Ur: NEGATIVE ng/mL

## 2016-12-11 LAB — CBC WITH DIFFERENTIAL/PLATELET
Basophils Absolute: 0 10*3/uL (ref 0.0–0.2)
Basos: 0 %
EOS (ABSOLUTE): 0.1 10*3/uL (ref 0.0–0.4)
Eos: 1 %
Hematocrit: 39.3 % (ref 34.0–46.6)
Hemoglobin: 13.4 g/dL (ref 11.1–15.9)
Immature Grans (Abs): 0 10*3/uL (ref 0.0–0.1)
Immature Granulocytes: 0 %
Lymphocytes Absolute: 1.8 10*3/uL (ref 0.7–3.1)
Lymphs: 25 %
MCH: 29.5 pg (ref 26.6–33.0)
MCHC: 34.1 g/dL (ref 31.5–35.7)
MCV: 87 fL (ref 79–97)
Monocytes Absolute: 0.6 10*3/uL (ref 0.1–0.9)
Monocytes: 8 %
Neutrophils Absolute: 4.9 10*3/uL (ref 1.4–7.0)
Neutrophils: 66 %
Platelets: 239 10*3/uL (ref 150–379)
RBC: 4.54 x10E6/uL (ref 3.77–5.28)
RDW: 14.1 % (ref 12.3–15.4)
WBC: 7.4 10*3/uL (ref 3.4–10.8)

## 2016-12-11 LAB — ABO

## 2016-12-11 LAB — RUBELLA SCREEN: Rubella Antibodies, IGG: 1.08 index (ref >0.99)

## 2016-12-11 LAB — HEMOGLOBIN A1C
Est. average glucose Bld gHb Est-mCnc: 100 mg/dL
Hgb A1c MFr Bld: 5.1 % (ref 4.8–5.6)

## 2016-12-11 LAB — ANTIBODY SCREEN: Antibody Screen: NEGATIVE

## 2016-12-11 LAB — HIV ANTIBODY (ROUTINE TESTING W REFLEX): HIV Screen 4th Generation wRfx: NONREACTIVE

## 2016-12-11 LAB — RH TYPE: Rh Factor: POSITIVE

## 2016-12-11 LAB — VARICELLA ZOSTER ANTIBODY, IGG: Varicella zoster IgG: 812 index (ref >165)

## 2016-12-11 LAB — VITAMIN D 1,25 DIHYDROXY
Vitamin D 1, 25 (OH)2 Total: 95 pg/mL — ABNORMAL HIGH
Vitamin D2 1, 25 (OH)2: 16 pg/mL
Vitamin D3 1, 25 (OH)2: 79 pg/mL

## 2016-12-11 LAB — HEPATITIS B SURFACE ANTIGEN: Hepatitis B Surface Ag: NEGATIVE

## 2016-12-11 LAB — TSH: TSH: 1.56 u[IU]/mL (ref 0.450–4.500)

## 2016-12-11 LAB — RPR: RPR Ser Ql: NONREACTIVE

## 2017-01-02 ENCOUNTER — Encounter: Payer: Self-pay | Admitting: Obstetrics and Gynecology

## 2017-01-02 ENCOUNTER — Ambulatory Visit (INDEPENDENT_AMBULATORY_CARE_PROVIDER_SITE_OTHER): Payer: Medicaid Other | Admitting: Obstetrics and Gynecology

## 2017-01-02 VITALS — BP 102/68 | HR 88 | Wt 178.0 lb

## 2017-01-02 DIAGNOSIS — B372 Candidiasis of skin and nail: Secondary | ICD-10-CM

## 2017-01-02 DIAGNOSIS — Z3401 Encounter for supervision of normal first pregnancy, first trimester: Secondary | ICD-10-CM | POA: Diagnosis not present

## 2017-01-02 DIAGNOSIS — Z3491 Encounter for supervision of normal pregnancy, unspecified, first trimester: Secondary | ICD-10-CM

## 2017-01-02 LAB — POCT URINALYSIS DIPSTICK
Bilirubin, UA: NEGATIVE
Blood, UA: NEGATIVE
Glucose, UA: NEGATIVE
Leukocytes, UA: NEGATIVE
Nitrite, UA: NEGATIVE
Protein, UA: NEGATIVE
Spec Grav, UA: 1.02 (ref 1.010–1.025)
Urobilinogen, UA: 0.2 E.U./dL
pH, UA: 5 (ref 5.0–8.0)

## 2017-01-02 NOTE — Progress Notes (Signed)
NOB:  Occ N/V - discussed hydration and increased caloric intake.  Pt vomiting 3X per week.  Desires [id number] - draw today.  AFP next visit.  Cutaneous yeast under breasts - discussed anti-fungal cream.

## 2017-01-02 NOTE — Progress Notes (Signed)
Physical examination General NAD, Conversant  HEENT Atraumatic; Op clear with mmm.  Normo-cephalic. Pupils reactive. Anicteric sclerae  Thyroid/Neck Smooth without nodularity or enlargement. Normal ROM.  Neck Supple.  Skin No rashes, lesions or ulceration. Normal palpated skin turgor. No nodularity.  Breasts: No masses or discharge.  Symmetric.  No axillary adenopathy.  Lungs: Clear to auscultation.No rales or wheezes. Normal Respiratory effort, no retractions.  Heart: NSR.  No murmurs or rubs appreciated. No periferal edema  Abdomen: Soft.  Non-tender.  No masses.  No HSM. No hernia  Extremities: Moves all appropriately.  Normal ROM for age. No lymphadenopathy.  Neuro: Oriented to PPT.  Normal mood. Normal affect.     Pelvic:   Vulva: Normal appearance.  No lesions.  Vagina: No lesions or abnormalities noted.  Support: Normal pelvic support.  Urethra No masses tenderness or scarring.  Meatus Normal size without lesions or prolapse.  Cervix: Normal appearance.  No lesions.  Anus: Normal exam.  No lesions.  Perineum: Normal exam.  No lesions.        Bimanual   Adnexae: No masses.  Non-tender to palpation.  Uterus: Enlarged. 13wks  Non-tender.  Mobile.  AV.  Adnexae: No masses.  Non-tender to palpation.  Cul-de-sac: Negative for abnormality.  Adnexae: No masses.  Non-tender to palpation.         Pelvimetry   Diagonal: Reached.  Spines: Average.  Sacrum: Concave.  Pubic Arch: Normal.

## 2017-01-05 ENCOUNTER — Encounter: Payer: Self-pay | Admitting: Obstetrics and Gynecology

## 2017-01-09 LAB — MATERNIT 21 PLUS CORE, BLOOD
Chromosome 13: NEGATIVE
Chromosome 18: NEGATIVE
Chromosome 21: NEGATIVE
Y Chromosome: NOT DETECTED

## 2017-01-10 ENCOUNTER — Telehealth: Payer: Self-pay | Admitting: Obstetrics and Gynecology

## 2017-01-10 NOTE — Telephone Encounter (Signed)
Please call with the genetic test results

## 2017-01-10 NOTE — Telephone Encounter (Signed)
Pt calls back, informed her of negative genetic results and female sex. PT gave verbal understanding.

## 2017-01-10 NOTE — Telephone Encounter (Signed)
Called number listed, no answer. Unable to leave message as no voicemail is set up.

## 2017-01-15 LAB — PAP IG, CT-NG, RFX HPV ASCU
Chlamydia, Nuc. Acid Amp: NEGATIVE
Gonococcus by Nucleic Acid Amp: NEGATIVE
PAP Smear Comment: 0

## 2017-01-15 LAB — HPV DNA PROBE HIGH RISK, AMPLIFIED: HPV, high-risk: NEGATIVE

## 2017-01-16 ENCOUNTER — Telehealth: Payer: Self-pay

## 2017-01-16 NOTE — Telephone Encounter (Signed)
mychart message sent

## 2017-01-16 NOTE — Telephone Encounter (Signed)
-----   Message from Linzie Collinavid James Evans, MD sent at 01/10/2017 11:15 AM EDT ----- Fetal Screening test is negative for abnormalities.

## 2017-01-17 ENCOUNTER — Telehealth: Payer: Self-pay | Admitting: Obstetrics and Gynecology

## 2017-01-17 NOTE — Telephone Encounter (Signed)
Spoke with pt to let her know the testing she had done was negative/normal.

## 2017-01-17 NOTE — Telephone Encounter (Signed)
Please call with results  Patient is having issues with her my chart app

## 2017-01-18 ENCOUNTER — Encounter: Payer: Self-pay | Admitting: Obstetrics and Gynecology

## 2017-01-29 ENCOUNTER — Encounter: Payer: Self-pay | Admitting: Obstetrics and Gynecology

## 2017-01-30 ENCOUNTER — Ambulatory Visit (INDEPENDENT_AMBULATORY_CARE_PROVIDER_SITE_OTHER): Payer: Medicaid Other | Admitting: Obstetrics and Gynecology

## 2017-01-30 VITALS — BP 113/67 | HR 93 | Wt 178.1 lb

## 2017-01-30 DIAGNOSIS — Z3402 Encounter for supervision of normal first pregnancy, second trimester: Secondary | ICD-10-CM

## 2017-01-30 LAB — POCT URINALYSIS DIPSTICK
Bilirubin, UA: NEGATIVE
Blood, UA: NEGATIVE
Glucose, UA: NEGATIVE
Ketones, UA: NEGATIVE
Nitrite, UA: NEGATIVE
Protein, UA: NEGATIVE
Spec Grav, UA: 1.01 (ref 1.010–1.025)
Urobilinogen, UA: 0.2 E.U./dL
pH, UA: 7.5 (ref 5.0–8.0)

## 2017-01-30 NOTE — Progress Notes (Signed)
ROB: Notes n/v has improved.  Normal [id number]. Will need anatomy scan next visit.  RTC in 4 weeks.

## 2017-01-30 NOTE — Patient Instructions (Signed)

## 2017-02-27 ENCOUNTER — Ambulatory Visit (INDEPENDENT_AMBULATORY_CARE_PROVIDER_SITE_OTHER): Payer: Medicaid Other

## 2017-02-27 ENCOUNTER — Encounter: Payer: Self-pay | Admitting: Obstetrics and Gynecology

## 2017-02-27 ENCOUNTER — Ambulatory Visit (INDEPENDENT_AMBULATORY_CARE_PROVIDER_SITE_OTHER): Payer: Medicaid Other | Admitting: Obstetrics and Gynecology

## 2017-02-27 VITALS — BP 115/71 | HR 94 | Wt 184.1 lb

## 2017-02-27 DIAGNOSIS — Z3402 Encounter for supervision of normal first pregnancy, second trimester: Secondary | ICD-10-CM

## 2017-02-27 LAB — POCT URINALYSIS DIPSTICK
Bilirubin, UA: NEGATIVE
Blood, UA: NEGATIVE
Glucose, UA: NEGATIVE
Ketones, UA: NEGATIVE
Leukocytes, UA: NEGATIVE
Nitrite, UA: NEGATIVE
Protein, UA: NEGATIVE
Spec Grav, UA: 1.025 (ref 1.010–1.025)
Urobilinogen, UA: 0.2 E.U./dL
pH, UA: 5 (ref 5.0–8.0)

## 2017-02-27 NOTE — Progress Notes (Signed)
ROB:  Anatomy scan today.  No complaints.

## 2017-03-27 ENCOUNTER — Ambulatory Visit (INDEPENDENT_AMBULATORY_CARE_PROVIDER_SITE_OTHER): Payer: Medicaid Other | Admitting: Obstetrics and Gynecology

## 2017-03-27 VITALS — BP 98/63 | HR 84 | Wt 192.1 lb

## 2017-03-27 DIAGNOSIS — O2242 Hemorrhoids in pregnancy, second trimester: Secondary | ICD-10-CM

## 2017-03-27 DIAGNOSIS — Z3403 Encounter for supervision of normal first pregnancy, third trimester: Secondary | ICD-10-CM | POA: Insufficient documentation

## 2017-03-27 DIAGNOSIS — Z131 Encounter for screening for diabetes mellitus: Secondary | ICD-10-CM

## 2017-03-27 DIAGNOSIS — Z130 Encounter for screening for diseases of the blood and blood-forming organs and certain disorders involving the immune mechanism: Secondary | ICD-10-CM

## 2017-03-27 DIAGNOSIS — Z3402 Encounter for supervision of normal first pregnancy, second trimester: Secondary | ICD-10-CM

## 2017-03-27 LAB — POCT URINALYSIS DIPSTICK
Bilirubin, UA: NEGATIVE
Blood, UA: NEGATIVE
Glucose, UA: NEGATIVE
Ketones, UA: NEGATIVE
Leukocytes, UA: NEGATIVE
Nitrite, UA: NEGATIVE
Spec Grav, UA: 1.01 (ref 1.010–1.025)
Urobilinogen, UA: 0.2 E.U./dL
pH, UA: 7.5 (ref 5.0–8.0)

## 2017-03-27 NOTE — Progress Notes (Signed)
Pt is here for a routine OB visit. Is undecided about Flu shot.

## 2017-03-27 NOTE — Progress Notes (Signed)
ROB: Notes hemorrhoids, but using Preparation-H is helping.  General questions regarding pregnancy answered. RTC in 4 weeks. For 28 week labs then. Declines flu vaccine.

## 2017-04-24 ENCOUNTER — Ambulatory Visit (INDEPENDENT_AMBULATORY_CARE_PROVIDER_SITE_OTHER): Payer: Medicaid Other | Admitting: Obstetrics and Gynecology

## 2017-04-24 ENCOUNTER — Other Ambulatory Visit: Payer: Medicaid Other

## 2017-04-24 VITALS — BP 100/66 | HR 99 | Wt 199.1 lb

## 2017-04-24 DIAGNOSIS — Z131 Encounter for screening for diabetes mellitus: Secondary | ICD-10-CM

## 2017-04-24 DIAGNOSIS — Z3403 Encounter for supervision of normal first pregnancy, third trimester: Secondary | ICD-10-CM

## 2017-04-24 DIAGNOSIS — Z130 Encounter for screening for diseases of the blood and blood-forming organs and certain disorders involving the immune mechanism: Secondary | ICD-10-CM

## 2017-04-24 LAB — POCT URINALYSIS DIPSTICK
Bilirubin, UA: NEGATIVE
Blood, UA: NEGATIVE
Glucose, UA: NEGATIVE
Ketones, UA: NEGATIVE
Leukocytes, UA: NEGATIVE
Nitrite, UA: NEGATIVE
Protein, UA: NEGATIVE
Spec Grav, UA: 1.02 (ref 1.010–1.025)
Urobilinogen, UA: 0.2 E.U./dL
pH, UA: 7 (ref 5.0–8.0)

## 2017-04-24 NOTE — Progress Notes (Signed)
ROB: She has no complaints.  She completed her 1 hour GCT today.  She declines flu shot.

## 2017-04-25 LAB — CBC
Hematocrit: 34.3 % (ref 34.0–46.6)
Hemoglobin: 11.2 g/dL (ref 11.1–15.9)
MCH: 29.8 pg (ref 26.6–33.0)
MCHC: 32.7 g/dL (ref 31.5–35.7)
MCV: 91 fL (ref 79–97)
Platelets: 182 10*3/uL (ref 150–379)
RBC: 3.76 x10E6/uL — ABNORMAL LOW (ref 3.77–5.28)
RDW: 14.2 % (ref 12.3–15.4)
WBC: 5.7 10*3/uL (ref 3.4–10.8)

## 2017-04-25 LAB — GLUCOSE, 1 HOUR GESTATIONAL: Gestational Diabetes Screen: 132 mg/dL (ref 65–139)

## 2017-05-08 ENCOUNTER — Encounter: Payer: Self-pay | Admitting: Obstetrics and Gynecology

## 2017-05-08 ENCOUNTER — Encounter: Payer: Medicaid Other | Admitting: Obstetrics and Gynecology

## 2017-05-08 ENCOUNTER — Ambulatory Visit (INDEPENDENT_AMBULATORY_CARE_PROVIDER_SITE_OTHER): Payer: Medicaid Other | Admitting: Obstetrics and Gynecology

## 2017-05-08 VITALS — BP 106/68 | HR 101 | Wt 203.8 lb

## 2017-05-08 DIAGNOSIS — O368130 Decreased fetal movements, third trimester, not applicable or unspecified: Secondary | ICD-10-CM

## 2017-05-08 DIAGNOSIS — Z3403 Encounter for supervision of normal first pregnancy, third trimester: Secondary | ICD-10-CM | POA: Diagnosis not present

## 2017-05-08 NOTE — Patient Instructions (Addendum)
Tdap Vaccine (Tetanus, Diphtheria and Pertussis): What You Need to Know 1. Why get vaccinated? Tetanus, diphtheria and pertussis are very serious diseases. Tdap vaccine can protect us from these diseases. And, Tdap vaccine given to pregnant women can protect newborn babies against pertussis. TETANUS (Lockjaw) is rare in the [place] today. It causes painful muscle tightening and stiffness, usually all over the body.  It can lead to tightening of muscles in the head and neck so you can't open your mouth, swallow, or sometimes even breathe. Tetanus kills about 1 out of 10 people who are infected even after receiving the best medical care.  DIPHTHERIA is also rare in the [place] today. It can cause a thick coating to form in the back of the throat.  It can lead to breathing problems, heart failure, paralysis, and death.  PERTUSSIS (Whooping Cough) causes severe coughing spells, which can cause difficulty breathing, vomiting and disturbed sleep.  It can also lead to weight loss, incontinence, and rib fractures. Up to 2 in 100 adolescents and 5 in 100 adults with pertussis are hospitalized or have complications, which could include pneumonia or death.  These diseases are caused by bacteria. Diphtheria and pertussis are spread from person to person through secretions from coughing or sneezing. Tetanus enters the body through cuts, scratches, or wounds. Before vaccines, as many as 200,000 cases of diphtheria, 200,000 cases of pertussis, and hundreds of cases of tetanus, were reported in the [place] each year. Since vaccination began, reports of cases for tetanus and diphtheria have dropped by about 99% and for pertussis by about 80%. 2. Tdap vaccine Tdap vaccine can protect adolescents and adults from tetanus, diphtheria, and pertussis. One dose of Tdap is routinely given at age 11 or 12. People who did not get Tdap at that age should get it as soon as possible. Tdap is especially  important for healthcare professionals and anyone having close contact with a baby younger than 12 months. Pregnant women should get a dose of Tdap during every pregnancy, to protect the newborn from pertussis. Infants are most at risk for severe, life-threatening complications from pertussis. Another vaccine, called Td, protects against tetanus and diphtheria, but not pertussis. A Td booster should be given every 10 years. Tdap may be given as one of these boosters if you have never gotten Tdap before. Tdap may also be given after a severe cut or burn to prevent tetanus infection. Your doctor or the person giving you the vaccine can give you more information. Tdap may safely be given at the same time as other vaccines. 3. Some people should not get this vaccine  A person who has ever had a life-threatening allergic reaction after a previous dose of any diphtheria, tetanus or pertussis containing vaccine, OR has a severe allergy to any part of this vaccine, should not get Tdap vaccine. Tell the person giving the vaccine about any severe allergies.  Anyone who had coma or long repeated seizures within 7 days after a childhood dose of DTP or DTaP, or a previous dose of Tdap, should not get Tdap, unless a cause other than the vaccine was found. They can still get Td.  Talk to your doctor if you: ? have seizures or another nervous system problem, ? had severe pain or swelling after any vaccine containing diphtheria, tetanus or pertussis, ? ever had a condition called Guillain-Barr Syndrome (GBS), ? aren't feeling well on the day the shot is scheduled. 4. Risks With any medicine, including   vaccines, there is a chance of side effects. These are usually mild and go away on their own. Serious reactions are also possible but are rare. Most people who get Tdap vaccine do not have any problems with it. Mild problems following Tdap: (Did not interfere with activities)  Pain where the shot was given (about  3 in 4 adolescents or 2 in 3 adults)  Redness or swelling where the shot was given (about 1 person in 5)  Mild fever of at least 100.4F (up to about 1 in 25 adolescents or 1 in 100 adults)  Headache (about 3 or 4 people in 10)  Tiredness (about 1 person in 3 or 4)  Nausea, vomiting, diarrhea, stomach ache (up to 1 in 4 adolescents or 1 in 10 adults)  Chills, sore joints (about 1 person in 10)  Body aches (about 1 person in 3 or 4)  Rash, swollen glands (uncommon)  Moderate problems following Tdap: (Interfered with activities, but did not require medical attention)  Pain where the shot was given (up to 1 in 5 or 6)  Redness or swelling where the shot was given (up to about 1 in 16 adolescents or 1 in 12 adults)  Fever over 102F (about 1 in 100 adolescents or 1 in 250 adults)  Headache (about 1 in 7 adolescents or 1 in 10 adults)  Nausea, vomiting, diarrhea, stomach ache (up to 1 or 3 people in 100)  Swelling of the entire arm where the shot was given (up to about 1 in 500).  Severe problems following Tdap: (Unable to perform usual activities; required medical attention)  Swelling, severe pain, bleeding and redness in the arm where the shot was given (rare).  Problems that could happen after any vaccine:  People sometimes faint after a medical procedure, including vaccination. Sitting or lying down for about 15 minutes can help prevent fainting, and injuries caused by a fall. Tell your doctor if you feel dizzy, or have vision changes or ringing in the ears.  Some people get severe pain in the shoulder and have difficulty moving the arm where a shot was given. This happens very rarely.  Any medication can cause a severe allergic reaction. Such reactions from a vaccine are very rare, estimated at fewer than 1 in a million doses, and would happen within a few minutes to a few hours after the vaccination. As with any medicine, there is a very remote chance of a vaccine  causing a serious injury or death. The safety of vaccines is always being monitored. For more information, visit: www.cdc.gov/vaccinesafety/ 5. What if there is a serious problem? What should I look for? Look for anything that concerns you, such as signs of a severe allergic reaction, very high fever, or unusual behavior. Signs of a severe allergic reaction can include hives, swelling of the face and throat, difficulty breathing, a fast heartbeat, dizziness, and weakness. These would usually start a few minutes to a few hours after the vaccination. What should I do?  If you think it is a severe allergic reaction or other emergency that can't wait, call 9-1-1 or get the person to the nearest hospital. Otherwise, call your doctor.  Afterward, the reaction should be reported to the Vaccine Adverse Event Reporting System (VAERS). Your doctor might file this report, or you can do it yourself through the VAERS web site at www.vaers.hhs.gov, or by calling 1-800-822-7967. ? VAERS does not give medical advice. 6. The National Vaccine Injury Compensation Program The National   Vaccine Injury Compensation Program (VICP) is a federal program that was created to compensate people who may have been injured by certain vaccines. Persons who believe they may have been injured by a vaccine can learn about the program and about filing a claim by calling 1-631-062-5579 or visiting the VICP website at SpiritualWord.atwww.hrsa.gov/vaccinecompensation. There is a time limit to file a claim for compensation. 7. How can I learn more?  Ask your doctor. He or she can give you the vaccine package insert or suggest other sources of information.  Call your local or state health department.  Contact the Centers for Disease Control and Prevention (CDC): ? Call (585)483-89081-216-551-7111 (1-800-CDC-INFO) or ? Visit CDC's website at PicCapture.uywww.cdc.gov/vaccines CDC Tdap Vaccine VIS (08/18/13) This information is not intended to replace advice given to you by your  health care provider. Make sure you discuss any questions you have with your health care provider. Document Released: 12/11/2011 Document Revised: 03/01/2016 Document Reviewed: 03/01/2016 Elsevier Interactive Patient Education  2017 Elsevier Inc.    Fetal Movement Counts Patient Name: ________________________________________________ Patient Due Date: ____________________ What is a fetal movement count? A fetal movement count is the number of times that you feel your baby move during a certain amount of time. This may also be called a fetal kick count. A fetal movement count is recommended for every pregnant woman. You may be asked to start counting fetal movements as early as week 28 of your pregnancy. Pay attention to when your baby is most active. You may notice your baby's sleep and wake cycles. You may also notice things that make your baby move more. You should do a fetal movement count:  When your baby is normally most active.  At the same time each day.  A good time to count movements is while you are resting, after having something to eat and drink. How do I count fetal movements? 1. Find a quiet, comfortable area. Sit, or lie down on your side. 2. Write down the date, the start time and stop time, and the number of movements that you felt between those two times. Take this information with you to your health care visits. 3. For 2 hours, count kicks, flutters, swishes, rolls, and jabs. You should feel at least 10 movements during 2 hours. 4. You may stop counting after you have felt 10 movements. 5. If you do not feel 10 movements in 2 hours, have something to eat and drink. Then, keep resting and counting for 1 hour. If you feel at least 4 movements during that hour, you may stop counting. Contact a health care provider if:  You feel fewer than 4 movements in 2 hours.  Your baby is not moving like he or she usually does. Date: ____________ Start time: ____________ Stop time:  ____________ Movements: ____________ Date: ____________ Start time: ____________ Stop time: ____________ Movements: ____________ Date: ____________ Start time: ____________ Stop time: ____________ Movements: ____________ Date: ____________ Start time: ____________ Stop time: ____________ Movements: ____________ Date: ____________ Start time: ____________ Stop time: ____________ Movements: ____________ Date: ____________ Start time: ____________ Stop time: ____________ Movements: ____________ Date: ____________ Start time: ____________ Stop time: ____________ Movements: ____________ Date: ____________ Start time: ____________ Stop time: ____________ Movements: ____________ Date: ____________ Start time: ____________ Stop time: ____________ Movements: ____________ This information is not intended to replace advice given to you by your health care provider. Make sure you discuss any questions you have with your health care provider. Document Released: 07/11/2006 Document Revised: 02/08/2016 Document Reviewed: 07/21/2015 Elsevier Interactive  Patient Education  2018 Elsevier Inc.  

## 2017-05-08 NOTE — Progress Notes (Signed)
ROB: Patient reports of falling up steps on her knees, but the box she was holding fell into her stomach  Occurring at 11:15 this morning.  Denies pain currently. Also reports decreased fetal movement all day today, even prior to fall. Unsure about receiving Tdap, given information to review.   NST performed today was reviewed and was found to be reactive.  Continue recommended antenatal testing and prenatal care.   NONSTRESS TEST INTERPRETATION  INDICATIONS: Decreased fetal movement  FHR baseline: 120 bpm RESULTS:Reactive (with use of juice and acoustic stimulator) COMMENTS: Period of saltatory conduction for first 5 minutes of tracing but resolved   PLAN: 1. Continue fetal kick counts twice a day. 2.Continue routine prenatal care

## 2017-05-08 NOTE — Progress Notes (Signed)
ROB- Pt states she fell up steps and fell on her knees and the box that she was holding fell into her stomach, Denies pain, would like to discuss tdap vaccine first

## 2017-05-22 ENCOUNTER — Encounter: Payer: Self-pay | Admitting: Obstetrics and Gynecology

## 2017-05-22 ENCOUNTER — Ambulatory Visit (INDEPENDENT_AMBULATORY_CARE_PROVIDER_SITE_OTHER): Payer: Medicaid Other | Admitting: Obstetrics and Gynecology

## 2017-05-22 VITALS — BP 121/66 | HR 108 | Wt 204.5 lb

## 2017-05-22 DIAGNOSIS — Z3403 Encounter for supervision of normal first pregnancy, third trimester: Secondary | ICD-10-CM

## 2017-05-22 LAB — POCT URINALYSIS DIPSTICK
Bilirubin, UA: NEGATIVE
Blood, UA: NEGATIVE
Glucose, UA: NEGATIVE
Leukocytes, UA: NEGATIVE
Nitrite, UA: NEGATIVE
Protein, UA: NEGATIVE
Spec Grav, UA: 1.025 (ref 1.010–1.025)
Urobilinogen, UA: 0.2 E.U./dL
pH, UA: 5 (ref 5.0–8.0)

## 2017-05-22 NOTE — Progress Notes (Signed)
ROB:  Pt doing well.  No N/V.  Size > dates - consider U/S if stays ahead.

## 2017-06-01 ENCOUNTER — Observation Stay
Admission: EM | Admit: 2017-06-01 | Discharge: 2017-06-01 | Disposition: A | Discharge status: Home / Self Care | Payer: Medicaid Other | Attending: Obstetrics and Gynecology | Admitting: Obstetrics and Gynecology

## 2017-06-01 ENCOUNTER — Other Ambulatory Visit: Payer: Self-pay

## 2017-06-01 ENCOUNTER — Encounter: Payer: Self-pay | Admitting: Emergency Medicine

## 2017-06-01 DIAGNOSIS — Z3A34 34 weeks gestation of pregnancy: Secondary | ICD-10-CM | POA: Diagnosis not present

## 2017-06-01 DIAGNOSIS — O2693 Pregnancy related conditions, unspecified, third trimester: Secondary | ICD-10-CM | POA: Diagnosis not present
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
## 2017-06-01 NOTE — ED Notes (Signed)
Pt to go to L&D - placed on otf
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
## 2017-06-05 ENCOUNTER — Encounter: Payer: Self-pay | Admitting: Obstetrics and Gynecology

## 2017-06-05 ENCOUNTER — Ambulatory Visit (INDEPENDENT_AMBULATORY_CARE_PROVIDER_SITE_OTHER): Payer: Medicaid Other | Admitting: Obstetrics and Gynecology

## 2017-06-05 VITALS — BP 122/74 | HR 96 | Wt 208.3 lb

## 2017-06-05 DIAGNOSIS — Z23 Encounter for immunization: Secondary | ICD-10-CM | POA: Diagnosis not present

## 2017-06-05 DIAGNOSIS — Z3403 Encounter for supervision of normal first pregnancy, third trimester: Secondary | ICD-10-CM | POA: Diagnosis not present

## 2017-06-05 DIAGNOSIS — N949 Unspecified condition associated with female genital organs and menstrual cycle: Secondary | ICD-10-CM

## 2017-06-05 LAB — POCT URINALYSIS DIPSTICK
Bilirubin, UA: NEGATIVE
Blood, UA: NEGATIVE
Glucose, UA: NEGATIVE
Ketones, UA: NEGATIVE
Nitrite, UA: NEGATIVE
Protein, UA: NEGATIVE
Spec Grav, UA: 1.01 (ref 1.010–1.025)
Urobilinogen, UA: 0.2 E.U./dL
pH, UA: 7 (ref 5.0–8.0)

## 2017-06-05 NOTE — Progress Notes (Signed)
ROB- Pt has been having pelvic pain

## 2017-06-05 NOTE — Progress Notes (Signed)
ROB: Patient c/o right sided groin pain.  Unsure if she "pulled something".  Discussed stretching, heat/ice application.  For Tdap today.  RTC in 2 weeks.

## 2017-06-20 ENCOUNTER — Encounter: Payer: Self-pay | Admitting: Obstetrics and Gynecology

## 2017-06-20 ENCOUNTER — Encounter: Payer: Medicaid Other | Admitting: Obstetrics and Gynecology

## 2017-06-24 ENCOUNTER — Ambulatory Visit (INDEPENDENT_AMBULATORY_CARE_PROVIDER_SITE_OTHER): Payer: Medicaid Other | Admitting: Obstetrics and Gynecology

## 2017-06-24 ENCOUNTER — Encounter: Payer: Self-pay | Admitting: Obstetrics and Gynecology

## 2017-06-24 VITALS — BP 126/78 | HR 102 | Wt 212.5 lb

## 2017-06-24 DIAGNOSIS — Z3403 Encounter for supervision of normal first pregnancy, third trimester: Secondary | ICD-10-CM

## 2017-06-24 LAB — POCT URINALYSIS DIPSTICK
Bilirubin, UA: NEGATIVE
Blood, UA: NEGATIVE
Glucose, UA: NEGATIVE
Leukocytes, UA: NEGATIVE
Nitrite, UA: NEGATIVE
Protein, UA: NEGATIVE
Spec Grav, UA: 1.015 (ref 1.010–1.025)
Urobilinogen, UA: 0.2 E.U./dL
pH, UA: 7 (ref 5.0–8.0)

## 2017-06-24 NOTE — Progress Notes (Signed)
ROB: GC/CT-GBS performed.  S&S discussed.  Patient considering primary cesarean delivery for family scheduling concerns.  I have discouraged this and discussed the possibility of scheduling induction instead.  Patient seems adamant regarding cesarean.  She will think about it for a few days and then inform us of her decision.

## 2017-06-25 NOTE — L&D Delivery Note (Signed)
       Delivery Note   Carolyn Georgeda Pischke is a 32 y.o. G1P0 at 3282w1d Estimated Date of Delivery: 07/11/17  PRE-OPERATIVE DIAGNOSIS:  1) 3382w1d pregnancy.   POST-OPERATIVE DIAGNOSIS:  1) 5982w1d pregnancy s/p induced vaginal delivery  Delivery Type: Vaginal   Delivery Anesthesia: Epidural   Labor Complications:       ESTIMATED BLOOD LOSS: 150  ml    FINDINGS:   1) female infant, Apgar scores of 8    at 1 minute and 9    at 5 minutes and a birthweight of 128.4  ounces.    2) Nuchal cord: No  SPECIMENS:   PLACENTA:   Appearance: Intact    Removal: Spontaneous      Disposition:     DISPOSITION:  Infant to left in stable condition in the delivery room, with L&D personnel and mother,  NARRATIVE SUMMARY: Labor course:  [name] is a G1P0 at 4982w1d who presented for induction of labor.  She progressed well in labor with misoprostol.  She received the appropriate anesthesia and proceeded to complete dilation. She evidenced good maternal expulsive effort during the second stage. She went on to deliver a viable infant. The placenta delivered without problems and was noted to be complete. A perineal and vaginal examination was performed. Episiotomy/Lacerations: 1st degree  Episiotomy or lacerations were repaired with Vicryl suture using local anesthesia. The patient tolerated this well.  Elonda Huskyavid J. Sharnetta Gielow, M.D. 07/12/2017 5:21 PM

## 2017-06-26 ENCOUNTER — Telehealth: Payer: Self-pay | Admitting: Obstetrics and Gynecology

## 2017-06-26 ENCOUNTER — Telehealth: Payer: Self-pay

## 2017-06-26 LAB — STREP GP B NAA: Strep Gp B NAA: NEGATIVE

## 2017-06-26 LAB — GC/CHLAMYDIA PROBE AMP
Chlamydia trachomatis, NAA: NEGATIVE
Neisseria gonorrhoeae by PCR: NEGATIVE

## 2017-06-26 NOTE — Telephone Encounter (Signed)
Spoke with pt- she decided she doesn't want a c/s. Provider informed.

## 2017-06-26 NOTE — Telephone Encounter (Signed)
The patient called and stated that she needs to speak with Jasmine DecemberSharon, No other information was disclosed. Please advise.

## 2017-07-02 ENCOUNTER — Encounter: Payer: Self-pay | Admitting: Obstetrics and Gynecology

## 2017-07-02 ENCOUNTER — Ambulatory Visit (INDEPENDENT_AMBULATORY_CARE_PROVIDER_SITE_OTHER): Payer: Medicaid Other | Admitting: Obstetrics and Gynecology

## 2017-07-02 VITALS — BP 115/67 | HR 85 | Wt 213.3 lb

## 2017-07-02 DIAGNOSIS — Z3403 Encounter for supervision of normal first pregnancy, third trimester: Secondary | ICD-10-CM

## 2017-07-02 NOTE — Progress Notes (Signed)
ROB: Patient notes complaints of worsening bouts of strong contractions that last for 30 minutes and then resolve. Further discussed IOL vs primary C-section (as patient requested last visit).  Patient desires to schedule delivery due to family circumstances ([place]), she does not have anyone else to care for her son, so will need family to come in. Cervix today unfavorable. Discussed IOL at due date. Patient no longer desires to schedule C-section. Will schedule for 07/12/16. Patient also desires to discontinue working at this time. Will bring FMLA papers tomorrow. RTc in 1 week.

## 2017-07-02 NOTE — Patient Instructions (Addendum)
Labor Induction Labor induction is when steps are taken to cause a pregnant woman to begin the labor process. Most women go into labor on their own between 37 weeks and 42 weeks of the pregnancy. When this does not happen or when there is a medical need, methods may be used to induce labor. Labor induction causes a pregnant woman's uterus to contract. It also causes the cervix to soften (ripen), open (dilate), and thin out (efface). Usually, labor is not induced before 39 weeks of the pregnancy unless there is a problem with the baby or mother. Before inducing labor, your health care provider will consider a number of factors, including the following:  The medical condition of you and the baby.  How many weeks along you are.  The status of the baby's lung maturity.  The condition of the cervix.  The position of the baby. What are the reasons for labor induction? Labor may be induced for the following reasons:  The health of the baby or mother is at risk.  The pregnancy is overdue by 1 week or more.  The water breaks but labor does not start on its own.  The mother has a health condition or serious illness, such as high blood pressure, infection, placental abruption, or diabetes.  The amniotic fluid amounts are low around the baby.  The baby is distressed. Convenience or wanting the baby to be born on a certain date is not a reason for inducing labor. What methods are used for labor induction? Several methods of labor induction may be used, such as:  Prostaglandin medicine. This medicine causes the cervix to dilate and ripen. The medicine will also start contractions. It can be taken by mouth or by inserting a suppository into the vagina.  Inserting a thin tube (catheter) with a balloon on the end into the vagina to dilate the cervix. Once inserted, the balloon is expanded with water, which causes the cervix to open.  Stripping the membranes. Your health care provider separates  amniotic sac tissue from the cervix, causing the cervix to be stretched and causing the release of a hormone called progesterone. This may cause the uterus to contract. It is often done during an office visit. You will be sent home to wait for the contractions to begin. You will then come in for an induction.  Breaking the water. Your health care provider makes a hole in the amniotic sac using a small instrument. Once the amniotic sac breaks, contractions should begin. This may still take hours to see an effect.  Medicine to trigger or strengthen contractions. This medicine is given through an IV access tube inserted into a vein in your arm. All of the methods of induction, besides stripping the membranes, will be done in the hospital. Induction is done in the hospital so that you and the baby can be carefully monitored. How long does it take for labor to be induced? Some inductions can take up to 2-3 days. Depending on the cervix, it usually takes less time. It takes longer when you are induced early in the pregnancy or if this is your first pregnancy. If a mother is still pregnant and the induction has been going on for 2-3 days, either the mother will be sent home or a cesarean delivery will be needed. What are the risks associated with labor induction? Some of the risks of induction include:  Changes in fetal heart rate, such as too high, too low, or erratic.  Fetal distress.    Chance of infection for the mother and baby.  Increased chance of having a cesarean delivery.  Breaking off (abruption) of the placenta from the uterus (rare).  Uterine rupture (very rare). When induction is needed for medical reasons, the benefits of induction may outweigh the risks. What are some reasons for not inducing labor? Labor induction should not be done if:  It is shown that your baby does not tolerate labor.  You have had previous surgeries on your uterus, such as a myomectomy or the removal of  fibroids.  Your placenta lies very low in the uterus and blocks the opening of the cervix (placenta previa).  Your baby is not in a head-down position.  The umbilical cord drops down into the birth canal in front of the baby. This could cut off the baby's blood and oxygen supply.  You have had a previous cesarean delivery.  There are unusual circumstances, such as the baby being extremely premature. This information is not intended to replace advice given to you by your health care provider. Make sure you discuss any questions you have with your health care provider. Document Released: 10/31/2006 Document Revised: 11/17/2015 Document Reviewed: 01/08/2013 Elsevier Interactive Patient Education  2017 [employer]  

## 2017-07-10 ENCOUNTER — Encounter: Payer: Self-pay | Admitting: Obstetrics and Gynecology

## 2017-07-10 ENCOUNTER — Ambulatory Visit (INDEPENDENT_AMBULATORY_CARE_PROVIDER_SITE_OTHER): Payer: Medicaid Other | Admitting: Obstetrics and Gynecology

## 2017-07-10 ENCOUNTER — Encounter: Payer: Medicaid Other | Admitting: Obstetrics and Gynecology

## 2017-07-10 VITALS — BP 129/81 | HR 101 | Wt 213.0 lb

## 2017-07-10 DIAGNOSIS — Z3403 Encounter for supervision of normal first pregnancy, third trimester: Secondary | ICD-10-CM

## 2017-07-10 LAB — POCT URINALYSIS DIPSTICK
Bilirubin, UA: NEGATIVE
Blood, UA: NEGATIVE
Glucose, UA: NEGATIVE
Ketones, UA: NEGATIVE
Leukocytes, UA: NEGATIVE
Nitrite, UA: NEGATIVE
Spec Grav, UA: 1.02 (ref 1.010–1.025)
Urobilinogen, UA: 0.2 E.U./dL
pH, UA: 7 (ref 5.0–8.0)

## 2017-07-10 NOTE — Progress Notes (Signed)
ROB: Notes spotting yesterday that has subsided. Scheduled for elective IOL on 07/12/17.

## 2017-07-10 NOTE — Progress Notes (Signed)
ROB- Pt states she was spotting yesterday but has subsided

## 2017-07-12 ENCOUNTER — Other Ambulatory Visit: Payer: Self-pay

## 2017-07-12 ENCOUNTER — Inpatient Hospital Stay: Payer: Medicaid Other | Admitting: Certified Registered Nurse Anesthetist

## 2017-07-12 ENCOUNTER — Inpatient Hospital Stay
Admission: EM | Admit: 2017-07-12 | Discharge: 2017-07-13 | DRG: 806 | Disposition: A | Discharge status: Home / Self Care | Payer: Medicaid Other | Source: Ambulatory Visit | Attending: Obstetrics and Gynecology | Admitting: Obstetrics and Gynecology

## 2017-07-12 DIAGNOSIS — Z3A40 40 weeks gestation of pregnancy: Secondary | ICD-10-CM

## 2017-07-12 DIAGNOSIS — O2243 Hemorrhoids in pregnancy, third trimester: Secondary | ICD-10-CM | POA: Diagnosis present

## 2017-07-12 DIAGNOSIS — Z3490 Encounter for supervision of normal pregnancy, unspecified, unspecified trimester: Secondary | ICD-10-CM

## 2017-07-12 DIAGNOSIS — O480 Post-term pregnancy: Principal | ICD-10-CM | POA: Diagnosis present

## 2017-07-12 LAB — CBC
HCT: 39.3 % (ref 35.0–47.0)
Hemoglobin: 13.3 g/dL (ref 12.0–16.0)
MCH: 30 pg (ref 26.0–34.0)
MCHC: 34 g/dL (ref 32.0–36.0)
MCV: 88.2 fL (ref 80.0–100.0)
Platelets: 171 10*3/uL (ref 150–440)
RBC: 4.45 MIL/uL (ref 3.80–5.20)
RDW: 14.1 % (ref 11.5–14.5)
WBC: 5.8 10*3/uL (ref 3.6–11.0)

## 2017-07-12 LAB — RAPID HIV SCREEN (HIV 1/2 AB+AG)
HIV 1/2 Antibodies: NONREACTIVE
HIV-1 P24 Antigen - HIV24: NONREACTIVE

## 2017-07-12 LAB — TYPE AND SCREEN
ABO/RH(D): AB POS
Antibody Screen: NEGATIVE

## 2017-07-12 MED ORDER — TERBUTALINE SULFATE 1 MG/ML IJ SOLN: 0.2500 mg | Freq: Once | INTRAMUSCULAR | Status: DC | PRN

## 2017-07-12 MED ORDER — OXYTOCIN 40 UNITS IN LACTATED RINGERS INFUSION - SIMPLE MED
2.5000 [IU]/h | INTRAVENOUS | Status: DC | PRN
  Filled 2017-07-12: qty 1000

## 2017-07-12 MED ORDER — ONDANSETRON HCL 4 MG/2ML IJ SOLN
4.0000 mg | Freq: Four times a day (QID) | INTRAMUSCULAR | Status: DC | PRN
  Administered 2017-07-12: 4 mg via INTRAVENOUS
  Filled 2017-07-12: qty 2

## 2017-07-12 MED ORDER — IBUPROFEN 600 MG PO TABS
600.0000 mg | ORAL_TABLET | Freq: Four times a day (QID) | ORAL | Status: DC
Start: 2017-07-13 — End: 2017-07-14
  Administered 2017-07-13 (×3): 600 mg via ORAL
  Filled 2017-07-12 (×3): qty 1

## 2017-07-12 MED ORDER — FENTANYL 2.5 MCG/ML W/ROPIVACAINE 0.15% IN NS 100 ML EPIDURAL (ARMC)
EPIDURAL | Status: DC | PRN
  Administered 2017-07-12: 12 mL/h via EPIDURAL

## 2017-07-12 MED ORDER — TETANUS-DIPHTH-ACELL PERTUSSIS 5-2.5-18.5 LF-MCG/0.5 IM SUSP: 0.5000 mL | Freq: Once | INTRAMUSCULAR | Status: DC

## 2017-07-12 MED ORDER — BUPIVACAINE HCL (PF) 0.25 % IJ SOLN
INTRAMUSCULAR | Status: DC | PRN
  Administered 2017-07-12 (×2): 4 mL via EPIDURAL

## 2017-07-12 MED ORDER — LACTATED RINGERS IV SOLN
INTRAVENOUS | Status: DC
  Administered 2017-07-12 (×2): via INTRAVENOUS

## 2017-07-12 MED ORDER — ACETAMINOPHEN 325 MG PO TABS: 650.0000 mg | ORAL_TABLET | ORAL | Status: DC | PRN

## 2017-07-12 MED ORDER — DIPHENHYDRAMINE HCL 25 MG PO CAPS: 25.0000 mg | ORAL_CAPSULE | Freq: Four times a day (QID) | ORAL | Status: DC | PRN

## 2017-07-12 MED ORDER — LACTATED RINGERS IV SOLN: 500.0000 mL | INTRAVENOUS | Status: DC | PRN

## 2017-07-12 MED ORDER — LACTATED RINGERS IV SOLN: INTRAVENOUS | Status: DC

## 2017-07-12 MED ORDER — ZOLPIDEM TARTRATE 5 MG PO TABS: 5.0000 mg | ORAL_TABLET | Freq: Every evening | ORAL | Status: DC | PRN

## 2017-07-12 MED ORDER — FENTANYL 2.5 MCG/ML W/ROPIVACAINE 0.15% IN NS 100 ML EPIDURAL (ARMC)
EPIDURAL | Status: AC
  Filled 2017-07-12: qty 100

## 2017-07-12 MED ORDER — OXYCODONE-ACETAMINOPHEN 5-325 MG PO TABS: 1.0000 | ORAL_TABLET | ORAL | Status: DC | PRN

## 2017-07-12 MED ORDER — BENZOCAINE-MENTHOL 20-0.5 % EX AERO
1.0000 "application " | INHALATION_SPRAY | CUTANEOUS | Status: DC | PRN
  Administered 2017-07-12: 1 via TOPICAL
  Filled 2017-07-12: qty 56

## 2017-07-12 MED ORDER — IBUPROFEN 600 MG PO TABS
600.0000 mg | ORAL_TABLET | Freq: Four times a day (QID) | ORAL | Status: DC
  Administered 2017-07-12: 600 mg via ORAL
  Filled 2017-07-12: qty 1

## 2017-07-12 MED ORDER — OXYTOCIN BOLUS FROM INFUSION
500.0000 mL | Freq: Once | INTRAVENOUS | Status: AC
  Administered 2017-07-12: 500 mL via INTRAVENOUS

## 2017-07-12 MED ORDER — PRENATAL MULTIVITAMIN CH
1.0000 | ORAL_TABLET | Freq: Every day | ORAL | Status: DC
  Administered 2017-07-13: 1 via ORAL

## 2017-07-12 MED ORDER — LIDOCAINE HCL (PF) 1 % IJ SOLN: 30.0000 mL | INTRAMUSCULAR | Status: DC | PRN

## 2017-07-12 MED ORDER — SOD CITRATE-CITRIC ACID 500-334 MG/5ML PO SOLN: 30.0000 mL | ORAL | Status: DC | PRN

## 2017-07-12 MED ORDER — SIMETHICONE 80 MG PO CHEW: 80.0000 mg | CHEWABLE_TABLET | ORAL | Status: DC | PRN

## 2017-07-12 MED ORDER — MISOPROSTOL 50MCG HALF TABLET
ORAL_TABLET | ORAL | Status: AC
  Administered 2017-07-12: 50 ug via VAGINAL
  Filled 2017-07-12: qty 1

## 2017-07-12 MED ORDER — LIDOCAINE HCL (PF) 1 % IJ SOLN
INTRAMUSCULAR | Status: DC | PRN
  Administered 2017-07-12: 3 mL

## 2017-07-12 MED ORDER — MISOPROSTOL 25 MCG QUARTER TABLET
50.0000 ug | ORAL_TABLET | ORAL | Status: DC | PRN
  Administered 2017-07-12: 50 ug via VAGINAL

## 2017-07-12 MED ORDER — DOCUSATE SODIUM 100 MG PO CAPS
100.0000 mg | ORAL_CAPSULE | Freq: Two times a day (BID) | ORAL | Status: DC
  Administered 2017-07-13: 100 mg via ORAL
  Filled 2017-07-12: qty 1

## 2017-07-12 MED ORDER — OXYCODONE-ACETAMINOPHEN 5-325 MG PO TABS: 2.0000 | ORAL_TABLET | ORAL | Status: DC | PRN

## 2017-07-12 MED ORDER — BUTORPHANOL TARTRATE 1 MG/ML IJ SOLN
1.0000 mg | INTRAMUSCULAR | Status: DC | PRN
  Administered 2017-07-12: 1 mg via INTRAVENOUS
  Filled 2017-07-12: qty 1

## 2017-07-12 MED ORDER — OXYTOCIN 40 UNITS IN LACTATED RINGERS INFUSION - SIMPLE MED
2.5000 [IU]/h | INTRAVENOUS | Status: DC
  Administered 2017-07-12: 2.5 [IU]/h via INTRAVENOUS

## 2017-07-12 MED ORDER — MISOPROSTOL 25 MCG QUARTER TABLET: 50.0000 ug | ORAL_TABLET | ORAL | Status: DC | PRN

## 2017-07-12 MED ORDER — LIDOCAINE HCL (PF) 1 % IJ SOLN
INTRAMUSCULAR | Status: AC
  Filled 2017-07-12: qty 30

## 2017-07-12 MED ORDER — LIDOCAINE-EPINEPHRINE (PF) 1.5 %-1:200000 IJ SOLN
INTRAMUSCULAR | Status: DC | PRN
  Administered 2017-07-12: 3 mL via EPIDURAL

## 2017-07-12 MED ORDER — OXYTOCIN 40 UNITS IN LACTATED RINGERS INFUSION - SIMPLE MED
INTRAVENOUS | Status: AC
  Administered 2017-07-12: 500 mL via INTRAVENOUS
  Filled 2017-07-12: qty 1000

## 2017-07-12 NOTE — Plan of Care (Signed)
SVD viable female infant after elective IOL with cytotec.  Epidural placed for labor pain management.  Patient tolerated labor well.  Pain controlled.  Vital signs stable.  Bonding appropriately.

## 2017-07-12 NOTE — Progress Notes (Signed)
Bedside shift report received from Joy, CalifBethanyorniaRN. Pt sitting upright in bed, breastfeeding infant. Denies any pain while sitting in bed and confirms she has been eating and drinking since delivery, still numbness and tingling in both legs. Discussed plan for shift with pt and s/o. Understanding verbalized.

## 2017-07-12 NOTE — Progress Notes (Signed)
LABOR NOTE   Carolyn Carolyn Lynch Carolyn Carolyn Lynch Carolyn Lynch 32 y.o.GP@ at 6246w1d Satisfactory labor progress.  SUBJECTIVE:  Pt uncomfortable with contractions - desires epidural OBJECTIVE:  BP (!) 150/82 (BP Location: Left Arm)   Pulse 70   Temp (!) 97.5 F (36.4 C) (Oral)   Resp 20   Ht 5\' 7"  (1.702 m)   Wt 213 lb (96.6 kg)   LMP 10/04/2016 (Approximate)   BMI 33.36 kg/m  No intake/output data recorded.  She has shown cervical change. CERVIX: 4:  75%:   -3:   :    SVE:   Dilation: 4 Effacement (%): 80 Station: -1 Exam by:: JWagoner, RN CONTRACTIONS: regular, every 2-3 minutes FHR: Fetal heart tracing reviewed. Variability: Good {> 6 bpm) Category II  occ earlies.  Low baseline   Analgesia: Call for epidural  Labs: Lab Results  Component Value Date   WBC 5.8 07/12/2017   HGB 13.3 07/12/2017   HCT 39.3 07/12/2017   MCV 88.2 07/12/2017   PLT 171 07/12/2017    ASSESSMENT: 1) Labor curve reviewed.       Progress: Adequate uterine activity - intensity and frequency. and Satisfactory labor progress.     Membranes: ruptured       Active Problems:   Post-dates pregnancy   Term pregnancy   PLAN: Fluid bolus - position change  Carolyn Carolyn Lynch Carolyn Carolyn Lynch J. Evans, M.D. 07/12/2017 1:07 PM

## 2017-07-12 NOTE — Anesthesia Preprocedure Evaluation (Signed)
Anesthesia Evaluation  Patient identified by MRN, date of birth, ID band Patient awake    Reviewed: Allergy & Precautions, NPO status , Patient's Chart, lab work & pertinent test results, reviewed documented beta blocker date and time   Airway Mallampati: II  TM Distance: >3 FB Neck ROM: Full    Dental   Pulmonary           Cardiovascular      Neuro/Psych    GI/Hepatic   Endo/Other    Renal/GU      Musculoskeletal   Abdominal   Peds  Hematology   Anesthesia Other Findings   Reproductive/Obstetrics (+) Pregnancy                             Anesthesia Physical Anesthesia Plan  ASA: II  Anesthesia Plan: Epidural   Post-op Pain Management:    Induction:   PONV Risk Score and Plan:   Airway Management Planned:   Additional Equipment:   Intra-op Plan:   Post-operative Plan:   Informed Consent:   Plan Discussed with:   Anesthesia Plan Comments:         Anesthesia Quick Evaluation

## 2017-07-12 NOTE — Anesthesia Procedure Notes (Signed)
Epidural Patient location during procedure: OB Start time: 07/12/2017 2:00 PM End time: 07/12/2017 2:24 PM  Staffing Anesthesiologist: Alver FisherPenwarden, Amy, MD Resident/CRNA: Malva CoganBeane, Kaide Gage, CRNA Performed: resident/CRNA   Preanesthetic Checklist Completed: patient identified, site marked, surgical consent, pre-op evaluation, timeout performed, IV checked, risks and benefits discussed and monitors and equipment checked  Epidural Patient position: sitting Prep: Betadine Patient monitoring: heart rate, continuous pulse ox and blood pressure Approach: midline Location: L3-L4 Injection technique: LOR saline  Needle:  Needle type: Tuohy  Needle gauge: 17 G Needle length: 9 cm and 9 Needle insertion depth: 7 cm Catheter type: closed end flexible Catheter size: 19 Gauge Test dose: negative and 1.5% lidocaine with Epi 1:200 K  Assessment Sensory level: T10 Events: blood not aspirated, injection not painful, no injection resistance, negative IV test and no paresthesia  Additional Notes Pt. Evaluated and documentation done after procedure finished. Patient identified. Risks/Benefits/Options discussed with patient including but not limited to bleeding, infection, nerve damage, paralysis, failed block, incomplete pain control, headache, blood pressure changes, nausea, vomiting, reactions to medication both or allergic, itching and postpartum back pain. Confirmed with bedside nurse the patient's most recent platelet count. Confirmed with patient that they are not currently taking any anticoagulation, have any bleeding history or any family history of bleeding disorders. Patient expressed understanding and wished to proceed. All questions were answered. Sterile technique was used throughout the entire procedure. Please see nursing notes for vital signs. Test dose was given through epidural catheter and negative prior to continuing to dose epidural or start infusion. Warning signs of high block given  to the patient including shortness of breath, tingling/numbness in hands, complete motor block, or any concerning symptoms with instructions to call for help. Patient was given instructions on fall risk and not to get out of bed. All questions and concerns addressed with instructions to call with any issues or inadequate analgesia.   Patient tolerated the insertion well without immediate complications.Reason for block:procedure for pain

## 2017-07-12 NOTE — H&P (Signed)
    History and Physical   HPI  Carolyn Lynch is a 32 y.o. G1P0 at 6528w1d Estimated Date of Delivery: 07/11/17 who is being admitted for  induction of labor for social indication.   OB History  Obstetric History   G1   P0   T0   P0   A0   L0    SAB0   TAB0   Ectopic0   Multiple0   Live Births0     # Outcome Date GA Lbr Len/2nd Weight Sex Delivery Anes PTL Lv  1 Current               PROBLEM LIST  Pregnancy complications or risks: Patient Active Problem List   Diagnosis Date Noted  . Post-dates pregnancy 07/12/2017  . Term pregnancy 07/12/2017  . Encounter for supervision of normal first pregnancy in third trimester 03/27/2017  . Hemorrhoids in pregnancy in second trimester 03/27/2017    Prenatal labs and studies: ABO, Rh: AB/Positive/-- (06/15 1624) Antibody: Negative (06/15 1624) Rubella: 1.08 (06/15 1624) RPR: Non Reactive (06/15 1624)  HBsAg: Negative (06/15 1624)  HIV: Non Reactive (06/15 1624)  MVH:QIONGEXBGBS:Negative (12/31 1122)   Past Medical History:  Diagnosis Date  . Vitamin D deficiency      Past Surgical History:  Procedure Laterality Date  . WISDOM TOOTH EXTRACTION       Medications    Current Discharge Medication List    CONTINUE these medications which have NOT CHANGED   Details  Prenatal Vit-Fe Fumarate-FA (PRENATAL MULTIVITAMIN) TABS tablet Take 1 tablet by mouth daily at 12 noon.   Associated Diagnoses: Threatened abortion in first trimester         Allergies  Patient has no known allergies.  Review of Systems  Pertinent items noted in HPI and remainder of comprehensive ROS otherwise negative.  Physical Exam  LMP 10/04/2016 (Approximate)   Lungs:  CTA B Cardio: RRR without M/R/G Abd: Soft, gravid, NT Presentation: cephalic EXT: No C/C/ 1+ Edema DTRs: 2+ B CERVIX: 2 cm  :  50%:   -3:    mid position:    firm  See Prenatal records for more detailed PE.  AROM - light meconium  -  50mcg Misoprostol placed    FHR:    Variability: Good {> 6 bpm)  Toco: Uterine Contractions: rare   Test Results  No results found for this or any previous visit (from the past 24 hour(s)). Group B Strep negative  Assessment   G1P0 at 928w1d Estimated Date of Delivery: 07/11/17  The fetus is reassuring.   Patient Active Problem List   Diagnosis Date Noted  . Post-dates pregnancy 07/12/2017  . Term pregnancy 07/12/2017  . Encounter for supervision of normal first pregnancy in third trimester 03/27/2017  . Hemorrhoids in pregnancy in second trimester 03/27/2017    Plan  1. Admit to L&D :    2. EFM: -- Category 1 3. Epidural if desired. Stadol for IV pain until epidural requested. 4. Admission labs   Elonda Huskyavid J. Evans, M.D. 07/12/2017 8:52 AM

## 2017-07-13 LAB — RPR: RPR Ser Ql: NONREACTIVE

## 2017-07-13 NOTE — Anesthesia Postprocedure Evaluation (Signed)
Anesthesia Post Note  Patient: Carolyn Lynch  Procedure(s) Performed: AN AD HOC LABOR EPIDURAL  Patient location during evaluation: Mother Baby Anesthesia Type: Epidural Level of consciousness: awake and alert Pain management: pain level controlled Vital Signs Assessment: post-procedure vital signs reviewed and stable Respiratory status: spontaneous breathing, nonlabored ventilation and respiratory function stable Cardiovascular status: stable Postop Assessment: no headache, no backache and patient able to bend at knees (Able to ambulate) Anesthetic complications: no     Last Vitals:  Vitals:   07/13/17 0356 07/13/17 0800  BP: 113/65 114/66  Pulse: 89 93  Resp: 18 17  Temp: 36.8 C 36.8 C  SpO2: 99% 98%    Last Pain:  Vitals:   07/13/17 0800  TempSrc: Oral  PainSc:                  Carolyn Lynch

## 2017-07-13 NOTE — Discharge Summary (Signed)
                              Discharge Summary  Date of Admission: 07/12/2017  Date of Discharge: 07/13/2017  Admitting Diagnosis: Induction of labor at 6069w1d  Mode of Delivery: normal spontaneous vaginal delivery                 Discharge Diagnosis: No other diagnosis   Intrapartum Procedures: Atificial rupture of membranes, epidural and laceration 1st   Post partum procedures:   Complications: none                      Discharge Day SOAP Note:  Progress Note - Vaginal Delivery  Carolyn Lynch is a 32 y.o. G1P1001 now PP day 1 s/p Vaginal, Spontaneous . Delivery was uncomplicated  Subjective  The patient has the following complaints: has no unusual complaints  Pain is controlled with current medications.   Patient is urinating without difficulty.  She is ambulating well.    Objective  Vital signs: BP 114/66 (BP Location: Left Arm)   Pulse 93   Temp 98.3 F (36.8 C) (Oral)   Resp 17   Ht 5\' 7"  (1.702 m)   Wt 213 lb (96.6 kg)   LMP 10/04/2016 (Approximate)   SpO2 98%   Breastfeeding? Unknown   BMI 33.36 kg/m   Physical Exam: Gen: NAD Fundus Fundal Tone: Firm  Lochia Amount: Moderate  Perineum Appearance: Approximated, Edematous     Data Review Labs: CBC Latest Ref Rng & Units 07/12/2017 04/24/2017 12/07/2016  WBC 3.6 - 11.0 K/uL 5.8 5.7 7.4  Hemoglobin 12.0 - 16.0 g/dL 16.113.3 09.611.2 04.513.4  Hematocrit 35.0 - 47.0 % 39.3 34.3 39.3  Platelets 150 - 440 K/uL 171 182 239   AB POS  Assessment/Plan  Active Problems:   Post-dates pregnancy   Term pregnancy    Plan for discharge today.   Discharge Instructions: Per After Visit Summary. Activity: Advance as tolerated. Pelvic rest for 6 weeks.  Also refer to After Visit Summary Diet: Regular Medications: Allergies as of 07/13/2017   No Known Allergies     Medication List    TAKE these medications   prenatal multivitamin Tabs tablet Take 1 tablet by mouth daily at 12 noon.      Outpatient follow up:    Follow-up Information    Linzie CollinEvans, Jermar Colter James, MD Follow up in 6 day(s).   Specialty:  Obstetrics and Gynecology Contact information: [address] [place] [id number] [phone]          Postpartum contraception: Will discuss at first office visit post-partum  Discharged Condition: good  Discharged to: home  Newborn Data: Disposition:home with mother  Apgars: APGAR (1 MIN): 8   APGAR (5 MINS): 9   APGAR (10 MINS):    Baby Feeding: Breast    Elonda Huskyavid J. Mavis Gravelle, M.D. 07/13/2017 10:31 AM

## 2017-07-16 ENCOUNTER — Encounter: Payer: Self-pay | Admitting: Obstetrics and Gynecology

## 2017-07-18 ENCOUNTER — Ambulatory Visit (INDEPENDENT_AMBULATORY_CARE_PROVIDER_SITE_OTHER): Payer: Medicaid Other | Admitting: Obstetrics and Gynecology

## 2017-07-18 ENCOUNTER — Encounter: Payer: Self-pay | Admitting: Obstetrics and Gynecology

## 2017-07-18 NOTE — Progress Notes (Signed)
HPI:      Carolyn Lynch is a 32 y.o. G1P1001 who LMP was Patient's last menstrual period was 10/04/2016 (approximate).  Subjective:   She presents today approximately 1 week from vaginal delivery.  She reports she is both breast and bottlefeeding.  She has not decided upon birth control at this time.  She reports no unusual problems.  She occasionally has irritation from the hemorrhoid but is using Preparation H successfully.    Hx: The following portions of the patient's history were reviewed and updated as appropriate:             She  has a past medical history of Vitamin D deficiency. She does not have any pertinent problems on file. She  has a past surgical history that includes Wisdom tooth extraction. Her family history includes Cancer in her paternal grandfather and paternal grandmother; Diabetes in her mother; Hyperlipidemia in her mother; Hypertension in her father; Stroke in her mother. She  reports that  has never smoked. she has never used smokeless tobacco. She reports that she does not drink alcohol or use drugs. She has No Known Allergies.       Review of Systems:  Review of Systems  Constitutional: Denied constitutional symptoms, night sweats, recent illness, fatigue, fever, insomnia and weight loss.  Eyes: Denied eye symptoms, eye pain, photophobia, vision change and visual disturbance.  Ears/Nose/Throat/Neck: Denied ear, nose, throat or neck symptoms, hearing loss, nasal discharge, sinus congestion and sore throat.  Cardiovascular: Denied cardiovascular symptoms, arrhythmia, chest pain/pressure, edema, exercise intolerance, orthopnea and palpitations.  Respiratory: Denied pulmonary symptoms, asthma, pleuritic pain, productive sputum, cough, dyspnea and wheezing.  Gastrointestinal: Denied, gastro-esophageal reflux, melena, nausea and vomiting.  Genitourinary: Denied genitourinary symptoms including symptomatic vaginal discharge, pelvic relaxation issues, and urinary  complaints.  Musculoskeletal: Denied musculoskeletal symptoms, stiffness, swelling, muscle weakness and myalgia.  Dermatologic: Denied dermatology symptoms, rash and scar.  Neurologic: Denied neurology symptoms, dizziness, headache, neck pain and syncope.  Psychiatric: Denied psychiatric symptoms, anxiety and depression.  Endocrine: Denied endocrine symptoms including hot flashes and night sweats.   Meds:   Current Outpatient Medications on File Prior to Visit  Medication Sig Dispense Refill  . Prenatal Vit-Fe Fumarate-FA (PRENATAL MULTIVITAMIN) TABS tablet Take 1 tablet by mouth daily at 12 noon.     No current facility-administered medications on file prior to visit.     Objective:     Vitals:   07/18/17 0805  BP: 136/74  Pulse: 83                Assessment:    G1P1001 Patient Active Problem List   Diagnosis Date Noted  . Post-dates pregnancy 07/12/2017  . Term pregnancy 07/12/2017  . Encounter for supervision of normal first pregnancy in third trimester 03/27/2017  . Hemorrhoids in pregnancy in second trimester 03/27/2017     1. Postpartum care and examination immediately after delivery     Patient doing well postpartum.  No issues with hypertension.   Plan:            1.  Continue current management - follow-up in 4-5 weeks Orders No orders of the defined types were placed in this encounter.   No orders of the defined types were placed in this encounter.     F/U  Return in about 5 weeks (around 08/22/2017).  Elonda Huskyavid J. Lorenzo Pereyra, M.D. 07/18/2017 8:26 AM

## 2017-08-23 ENCOUNTER — Encounter: Payer: Self-pay | Admitting: Obstetrics and Gynecology

## 2017-08-23 ENCOUNTER — Ambulatory Visit (INDEPENDENT_AMBULATORY_CARE_PROVIDER_SITE_OTHER): Payer: Medicaid Other | Admitting: Obstetrics and Gynecology

## 2017-08-23 NOTE — Progress Notes (Signed)
ppv pt is doing well.

## 2017-08-23 NOTE — Progress Notes (Signed)
HPI:      Carolyn Lynch is a 32 y.o. G1P1001 who LMP was Patient's last menstrual period was 10/04/2016 (approximate).  Subjective:   She presents today for her postpartum visit.  She is approximately 6 weeks postpartum.  She is breast-feeding full-time.  She states that her hemorrhoids have improved.  She does not desire birth control.    Hx: The following portions of the patient's history were reviewed and updated as appropriate:             She  has a past medical history of Vitamin D deficiency. She does not have any pertinent problems on file. She  has a past surgical history that includes Wisdom tooth extraction. Her family history includes Cancer in her paternal grandfather and paternal grandmother; Diabetes in her mother; Hyperlipidemia in her mother; Hypertension in her father; Stroke in her mother. She  reports that  has never smoked. she has never used smokeless tobacco. She reports that she does not drink alcohol or use drugs. She has a current medication list which includes the following prescription(s): prenatal multivitamin. She has No Known Allergies.       Review of Systems:  Review of Systems  Constitutional: Denied constitutional symptoms, night sweats, recent illness, fatigue, fever, insomnia and weight loss.  Eyes: Denied eye symptoms, eye pain, photophobia, vision change and visual disturbance.  Ears/Nose/Throat/Neck: Denied ear, nose, throat or neck symptoms, hearing loss, nasal discharge, sinus congestion and sore throat.  Cardiovascular: Denied cardiovascular symptoms, arrhythmia, chest pain/pressure, edema, exercise intolerance, orthopnea and palpitations.  Respiratory: Denied pulmonary symptoms, asthma, pleuritic pain, productive sputum, cough, dyspnea and wheezing.  Gastrointestinal: Denied, gastro-esophageal reflux, melena, nausea and vomiting.  Genitourinary: Denied genitourinary symptoms including symptomatic vaginal discharge, pelvic relaxation issues, and  urinary complaints.  Musculoskeletal: Denied musculoskeletal symptoms, stiffness, swelling, muscle weakness and myalgia.  Dermatologic: Denied dermatology symptoms, rash and scar.  Neurologic: Denied neurology symptoms, dizziness, headache, neck pain and syncope.  Psychiatric: Denied psychiatric symptoms, anxiety and depression.  Endocrine: Denied endocrine symptoms including hot flashes and night sweats.   Meds:   Current Outpatient Medications on File Prior to Visit  Medication Sig Dispense Refill  . Prenatal Vit-Fe Fumarate-FA (PRENATAL MULTIVITAMIN) TABS tablet Take 1 tablet by mouth daily at 12 noon.     No current facility-administered medications on file prior to visit.     Objective:     Vitals:   08/23/17 0918  BP: 112/76  Pulse: 89              Pelvic examination   Pelvic:   Vulva: Normal appearance.  No lesions.  No abnormal scarring.    Vagina: No lesions or abnormalities noted.  Moderate vaginal atrophy  Support: Normal pelvic support.  Urethra No masses tenderness or scarring.  Meatus Normal size without lesions or prolapse.  Cervix: Normal ectropion.  No lesions.  Anus: Normal exam.  No lesions.  Perineum: Normal exam.  No lesions.  Healed well.          Bimanual   Uterus: Normal size.  Non-tender.  Mobile.  AV.  Adnexae: No masses.  Non-tender to palpation.  Cul-de-sac: Negative for abnormality.      Assessment:    G1P1001 Patient Active Problem List   Diagnosis Date Noted  . Post-dates pregnancy 07/12/2017  . Term pregnancy 07/12/2017  . Encounter for supervision of normal first pregnancy in third trimester 03/27/2017  . Hemorrhoids in pregnancy in second trimester 03/27/2017  1. Postpartum care and examination immediately after delivery     Patient doing well postpartum without complaint.  Atrophy consistent with breast-feeding.   Plan:            1.  Patient may resume normal activities.  2.  Birth Control I discussed multiple  birth control options and methods with the patient.  The risks and benefits of each were reviewed. Patient does not desire birth control at this time.  I have informed her that breast-feeding is not a method of birth control and that she could still become pregnant.  She understands this. Orders No orders of the defined types were placed in this encounter.   No orders of the defined types were placed in this encounter.     F/U  Return in about 3 months (around 11/23/2017) for Annual Physical.  [name], M.D. 08/23/2017 9:39 AM

## 2017-08-27 ENCOUNTER — Encounter: Payer: Self-pay | Admitting: Obstetrics and Gynecology

## 2017-09-04 ENCOUNTER — Encounter: Payer: Self-pay | Admitting: Obstetrics and Gynecology

## 2017-09-05 ENCOUNTER — Ambulatory Visit (INDEPENDENT_AMBULATORY_CARE_PROVIDER_SITE_OTHER): Payer: Medicaid Other | Admitting: Obstetrics and Gynecology

## 2017-09-05 ENCOUNTER — Encounter: Payer: Self-pay | Admitting: Obstetrics and Gynecology

## 2017-09-05 VITALS — BP 105/72 | HR 108 | Ht 64.0 in | Wt 179.4 lb

## 2017-09-05 DIAGNOSIS — N644 Mastodynia: Secondary | ICD-10-CM | POA: Diagnosis not present

## 2017-09-05 DIAGNOSIS — O9279 Other disorders of lactation: Secondary | ICD-10-CM

## 2017-09-05 DIAGNOSIS — O9229 Other disorders of breast associated with pregnancy and the puerperium: Secondary | ICD-10-CM

## 2017-09-05 NOTE — Progress Notes (Signed)
HPI:      [name] is a 32 y.o. G1P1001 who LMP was Patient's last menstrual period was 10/04/2016 (approximate).  Subjective:   She presents today stating that she was having breast pain yesterday.  She used warm compresses on it and stopped breast-feeding-only pumped from that breast.  She reports that today she is not having any pain in the breast and her symptoms seem to have resolved.  She denies fever or chills.  She reports no issues with breast-feeding from the left breast.    Hx: The following portions of the patient's history were reviewed and updated as appropriate:             She  has a past medical history of Vitamin D deficiency. She does not have any pertinent problems on file. She  has a past surgical history that includes Wisdom tooth extraction. Her family history includes Cancer in her paternal grandfather and paternal grandmother; Diabetes in her mother; Hyperlipidemia in her mother; Hypertension in her father; Stroke in her mother. She  reports that  has never smoked. she has never used smokeless tobacco. She reports that she does not drink alcohol or use drugs. She has a current medication list which includes the following prescription(s): prenatal multivitamin. She has No Known Allergies.       Review of Systems:  Review of Systems  Constitutional: Denied constitutional symptoms, night sweats, recent illness, fatigue, fever, insomnia and weight loss.  Eyes: Denied eye symptoms, eye pain, photophobia, vision change and visual disturbance.  Ears/Nose/Throat/Neck: Denied ear, nose, throat or neck symptoms, hearing loss, nasal discharge, sinus congestion and sore throat.  Cardiovascular: Denied cardiovascular symptoms, arrhythmia, chest pain/pressure, edema, exercise intolerance, orthopnea and palpitations.  Respiratory: Denied pulmonary symptoms, asthma, pleuritic pain, productive sputum, cough, dyspnea and wheezing.  Gastrointestinal: Denied, gastro-esophageal  reflux, melena, nausea and vomiting.  Genitourinary: See HPI for additional information.  Musculoskeletal: Denied musculoskeletal symptoms, stiffness, swelling, muscle weakness and myalgia.  Dermatologic: Denied dermatology symptoms, rash and scar.  Neurologic: Denied neurology symptoms, dizziness, headache, neck pain and syncope.  Psychiatric: Denied psychiatric symptoms, anxiety and depression.  Endocrine: Denied endocrine symptoms including hot flashes and night sweats.   Meds:   Current Outpatient Medications on File Prior to Visit  Medication Sig Dispense Refill  . Prenatal Vit-Fe Fumarate-FA (PRENATAL MULTIVITAMIN) TABS tablet Take 1 tablet by mouth daily at 12 noon.     No current facility-administered medications on file prior to visit.     Objective:     Vitals:   09/05/17 0924  BP: 105/72  Pulse: (!) 108              Examination of the breasts reveals no dominant masses -slightly engorged consistent with breast-feeding..  The breasts are symmetric.  There is no erythema consistent with mastitis.  Patient was not having any pain today and no pain could be elicited with exam.  Assessment:    G1P1001 Patient Active Problem List   Diagnosis Date Noted  . Post-dates pregnancy 07/12/2017  . Term pregnancy 07/12/2017  . Encounter for supervision of normal first pregnancy in third trimester 03/27/2017  . Hemorrhoids in pregnancy in second trimester 03/27/2017     1. Pain of breast during breastfeeding     This pain has now resolved.  No evidence of mastitis.   Plan:            1.  Advised patient to continue breast-feeding from both breasts.  We discussed mastitis  versus clogged milk duct versus monilia infection of the nipples.  At this point will employ expectant management and and that she begins having additional symptoms no follow-up is necessary.  If she begins to have further problems she will call us. Orders No orders of the defined types were placed in this  encounter.   No orders of the defined types were placed in this encounter.     F/U  No Follow-up on file. I spent 16 minutes with this patient of which greater than 50% was spent discussing breast issues as noted above.  All questions answered.  Future care discussed.  [name], M.D. 09/05/2017 9:48 AM

## 2017-09-05 NOTE — Progress Notes (Signed)
Pt has having rt breast pain 2 days tender to the touch. Concerned about breast milk due to using heating pad.

## 2017-11-26 ENCOUNTER — Ambulatory Visit (INDEPENDENT_AMBULATORY_CARE_PROVIDER_SITE_OTHER): Payer: Medicaid Other | Admitting: Obstetrics and Gynecology

## 2017-11-26 ENCOUNTER — Encounter: Payer: Self-pay | Admitting: Obstetrics and Gynecology

## 2017-11-26 VITALS — BP 111/75 | HR 85 | Ht 64.0 in | Wt 189.0 lb

## 2017-11-26 DIAGNOSIS — Z0000 Encounter for general adult medical examination without abnormal findings: Secondary | ICD-10-CM

## 2017-11-26 DIAGNOSIS — G8929 Other chronic pain: Secondary | ICD-10-CM

## 2017-11-26 DIAGNOSIS — M545 Low back pain: Secondary | ICD-10-CM

## 2017-11-26 NOTE — Progress Notes (Signed)
HPI:      Carolyn Lynch is a 32 y.o. G1P1001 who LMP was No LMP recorded (lmp unknown).  Subjective:   She presents today for her annual examination.  She continues to breast-feed.  She is not having regular menstrual cycles yet.  She is not sexually active and does not desire birth control. She does complain of some low back pain which is intermittent.  She says it is worse in the morning.  Denies sciatica or referred pain of any kind.    Hx: The following portions of the patient's history were reviewed and updated as appropriate:             She  has a past medical history of Vitamin D deficiency. She does not have any pertinent problems on file. She  has a past surgical history that includes Wisdom tooth extraction. Her family history includes Cancer in her paternal grandfather and paternal grandmother; Diabetes in her mother; Hyperlipidemia in her mother; Hypertension in her father; Stroke in her mother. She  reports that she has never smoked. She has never used smokeless tobacco. She reports that she does not drink alcohol or use drugs. She has a current medication list which includes the following prescription(s): prenatal multivitamin. She has No Known Allergies.       Review of Systems:  Review of Systems  Constitutional: Denied constitutional symptoms, night sweats, recent illness, fatigue, fever, insomnia and weight loss.  Eyes: Denied eye symptoms, eye pain, photophobia, vision change and visual disturbance.  Ears/Nose/Throat/Neck: Denied ear, nose, throat or neck symptoms, hearing loss, nasal discharge, sinus congestion and sore throat.  Cardiovascular: Denied cardiovascular symptoms, arrhythmia, chest pain/pressure, edema, exercise intolerance, orthopnea and palpitations.  Respiratory: Denied pulmonary symptoms, asthma, pleuritic pain, productive sputum, cough, dyspnea and wheezing.  Gastrointestinal: Denied, gastro-esophageal reflux, melena, nausea and vomiting.   Genitourinary: Denied genitourinary symptoms including symptomatic vaginal discharge, pelvic relaxation issues, and urinary complaints.  Musculoskeletal: Denied musculoskeletal symptoms, stiffness, swelling, muscle weakness and myalgia.  Dermatologic: Denied dermatology symptoms, rash and scar.  Neurologic: Denied neurology symptoms, dizziness, headache, neck pain and syncope.  Psychiatric: Denied psychiatric symptoms, anxiety and depression.  Endocrine: Denied endocrine symptoms including hot flashes and night sweats.   Meds:   Current Outpatient Medications on File Prior to Visit  Medication Sig Dispense Refill  . Prenatal Vit-Fe Fumarate-FA (PRENATAL MULTIVITAMIN) TABS tablet Take 1 tablet by mouth daily at 12 noon.     No current facility-administered medications on file prior to visit.     Objective:     Vitals:   11/26/17 0809  BP: 111/75  Pulse: 85              Physical examination General NAD, Conversant  HEENT Atraumatic; Op clear with mmm.  Normo-cephalic. Pupils reactive. Anicteric sclerae  Thyroid/Neck Smooth without nodularity or enlargement. Normal ROM.  Neck Supple.  Skin No rashes, lesions or ulceration. Normal palpated skin turgor. No nodularity.  Breasts: No masses or discharge.  Symmetric.  No axillary adenopathy.  Lungs: Clear to auscultation.No rales or wheezes. Normal Respiratory effort, no retractions.  Heart: NSR.  No murmurs or rubs appreciated. No periferal edema  Abdomen: Soft.  Non-tender.  No masses.  No HSM. No hernia  Extremities: Moves all appropriately.  Normal ROM for age. No lymphadenopathy.  Neuro: Oriented to PPT.  Normal mood. Normal affect.     Pelvic:   Vulva: Normal appearance.  No lesions.  Vagina: No lesions or abnormalities noted.  Support:  Normal pelvic support.  Urethra No masses tenderness or scarring.  Meatus Normal size without lesions or prolapse.  Cervix: Normal appearance.  No lesions.  Anus: Normal exam.  No lesions.   Perineum: Normal exam.  No lesions.        Bimanual   Uterus: Normal size.  Non-tender.  Mobile.  AV.  Adnexae: No masses.  Non-tender to palpation.  Cul-de-sac: Negative for abnormality.      Assessment:    G1P1001 Patient Active Problem List   Diagnosis Date Noted  . Post-dates pregnancy 07/12/2017  . Term pregnancy 07/12/2017  . Encounter for supervision of normal first pregnancy in third trimester 03/27/2017  . Hemorrhoids in pregnancy in second trimester 03/27/2017     1. Encounter for annual physical exam   2. Chronic midline low back pain without sciatica     Pap smear performed because of previous history of ASCUS despite negative HPV     Plan:            1.  Basic Screening Recommendations The basic screening recommendations for asymptomatic women were discussed with the patient during her visit.  The age-appropriate recommendations were discussed with her and the rational for the tests reviewed.  When I am informed by the patient that another primary care physician has previously obtained the age-appropriate tests and they are up-to-date, only outstanding tests are ordered and referrals given as necessary.  Abnormal results of tests will be discussed with her when all of her results are completed.  Pap smear performed because of previous history of ASCUS despite negative HPV  2.  Discussed low back pain in detail.  Offered referral to orthopedics.  Patient would like to try for a while and see if it improves if not she will contact us regarding referral. Orders No orders of the defined types were placed in this encounter.   No orders of the defined types were placed in this encounter.       F/U  Return in about 1 year (around 11/27/2018).  [name], M.D. 11/26/2017 8:46 AM

## 2017-11-26 NOTE — Addendum Note (Signed)
Addended by: Marchelle FolksMILLER, Lateesha Bezold G on: 11/26/2017 09:01 AM   Modules accepted: Orders

## 2017-11-29 LAB — IGP, COBASHPV16/18
HPV 16: NEGATIVE
HPV 18: NEGATIVE
HPV other hr types: NEGATIVE
PAP Smear Comment: 0

## 2018-08-30 IMAGING — US US OB COMP LESS 14 WK
1 series · 14 of 28 positions shown · non-contrast
Comparison: 11/29/2016

CLINICAL DATA: Vaginal bleeding

EXAM:
OBSTETRIC <14 WK US AND TRANSVAGINAL OB US
TECHNIQUE: Both transabdominal and transvaginal ultrasound examinations were
performed for complete evaluation of the gestation as well as the
maternal uterus, adnexal regions, and pelvic cul-de-sac.
Transvaginal technique was performed to assess early pregnancy.

[Series 1: us ob comp less 14 wk · 0.19mm/px · 14 of 91 slices shown]
[im 4/91]
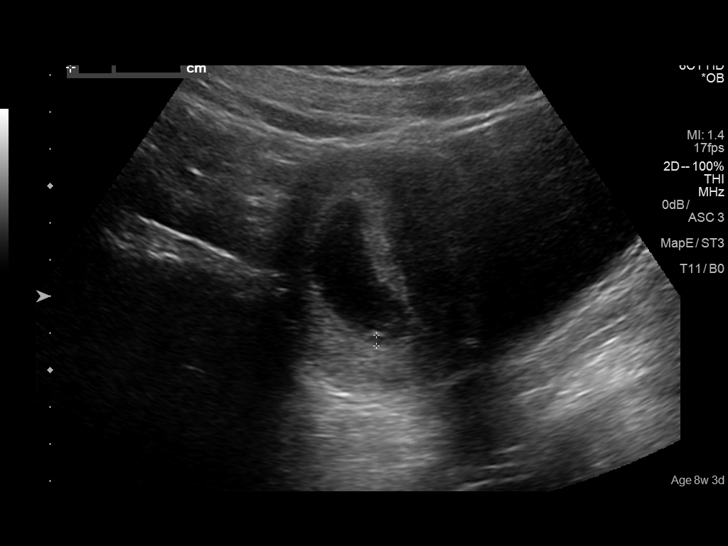
[im 11/91]
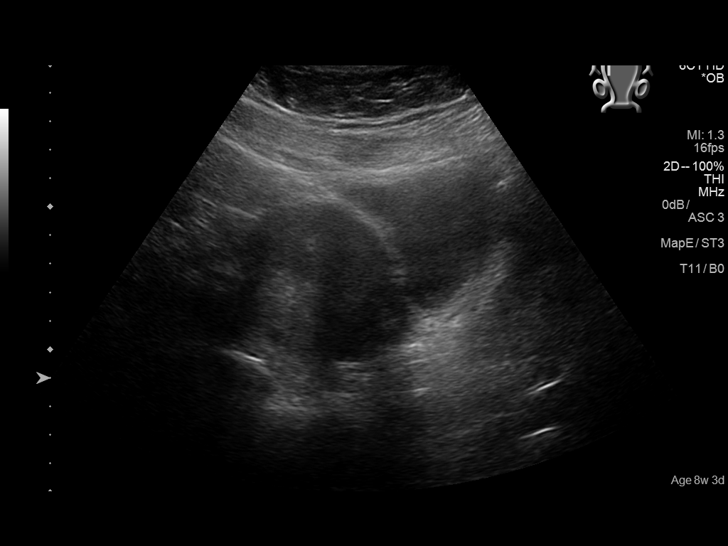
[im 17/91]
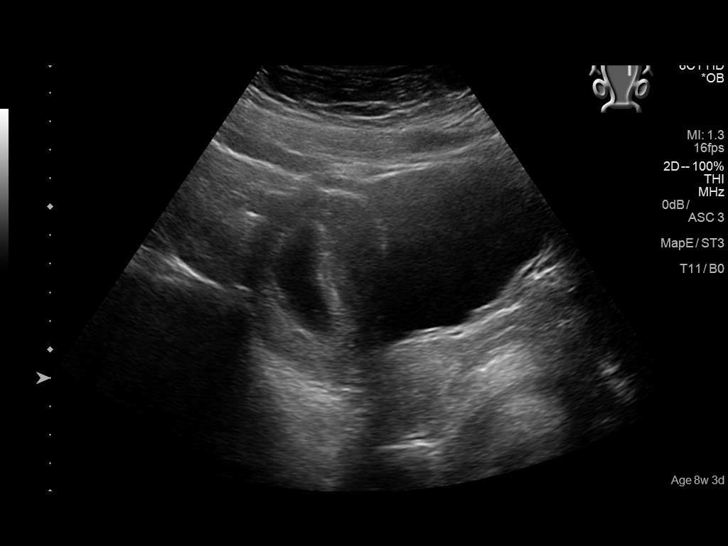
[im 24/91]
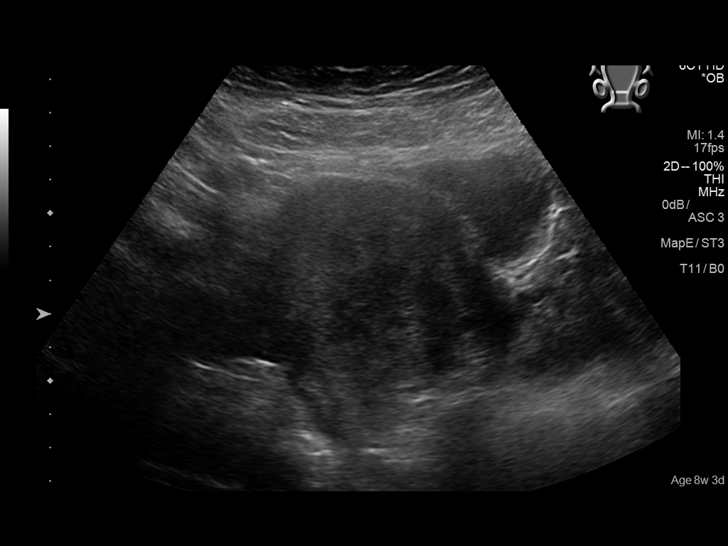
[im 31/91]
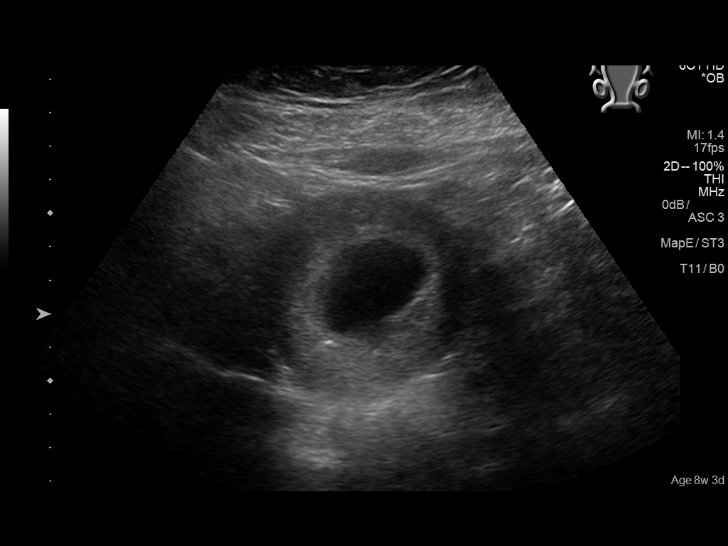
[im 37/91]
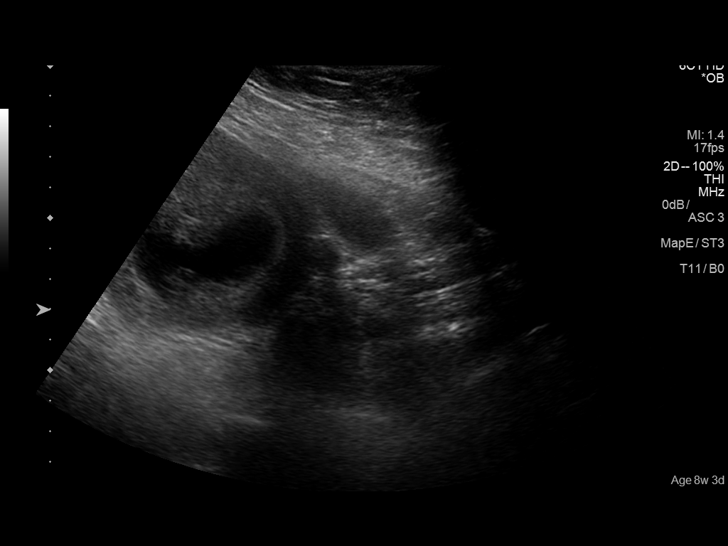
[im 44/91]
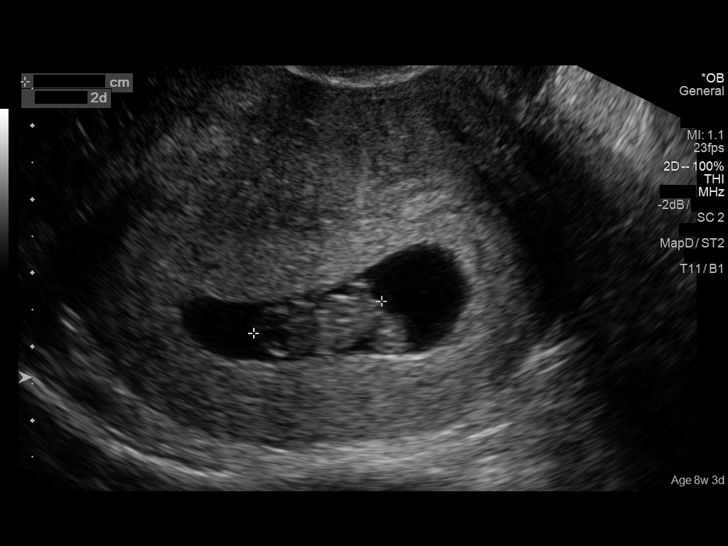
[im 51/91]
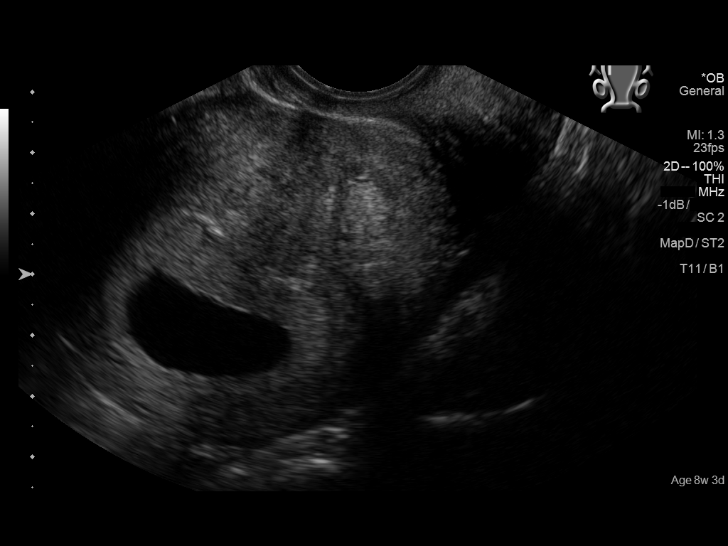
[im 57/91]
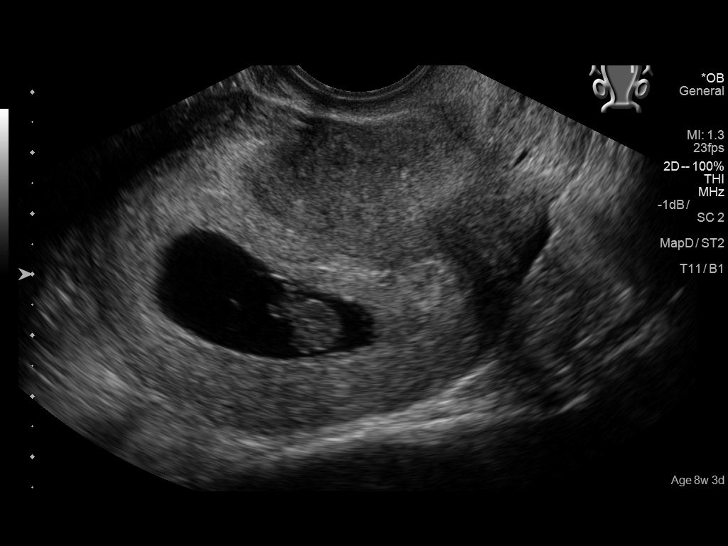
[im 64/91]
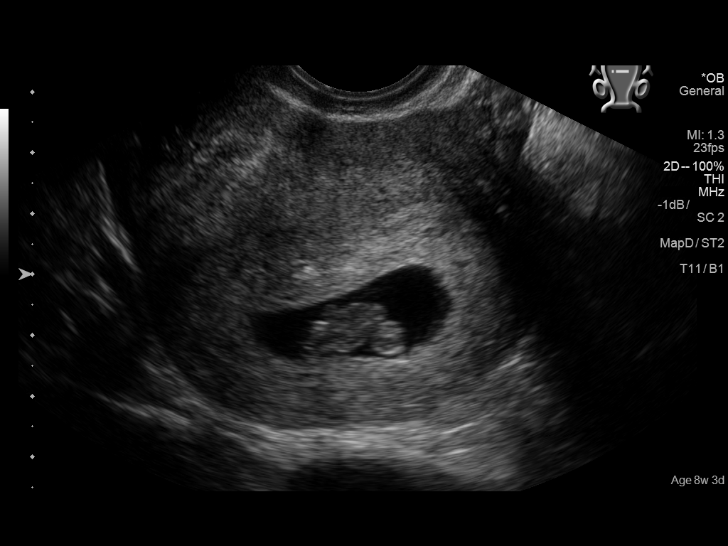
[im 71/91]
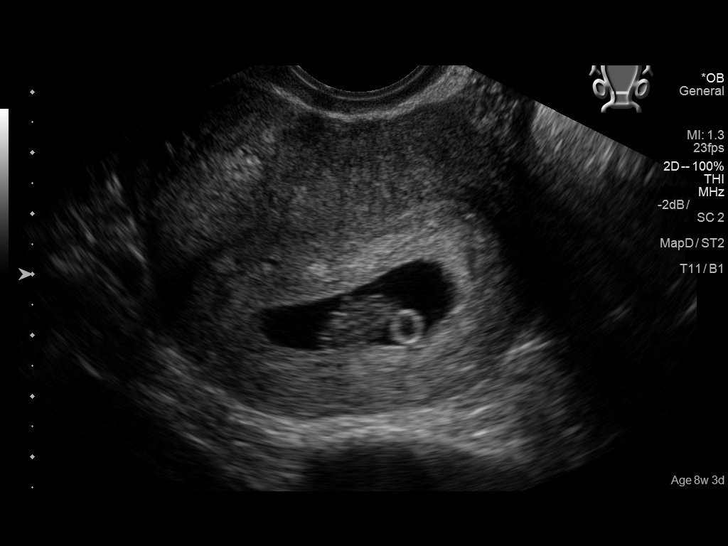
[im 77/91]
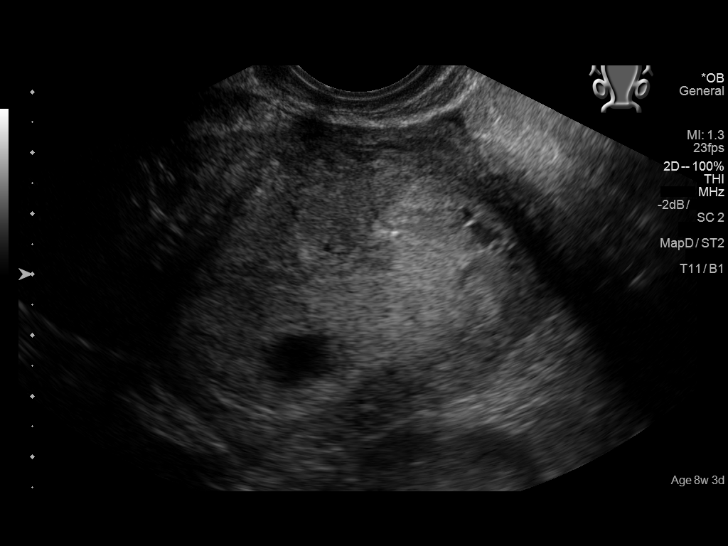
[im 84/91]
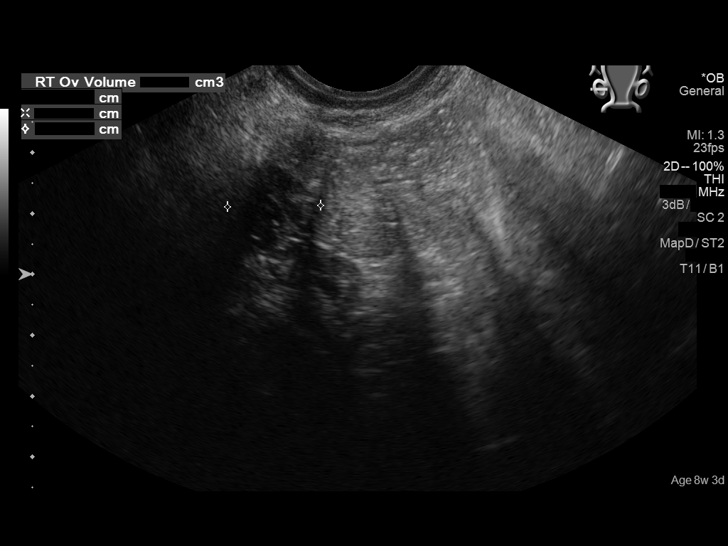
[im 91/91]
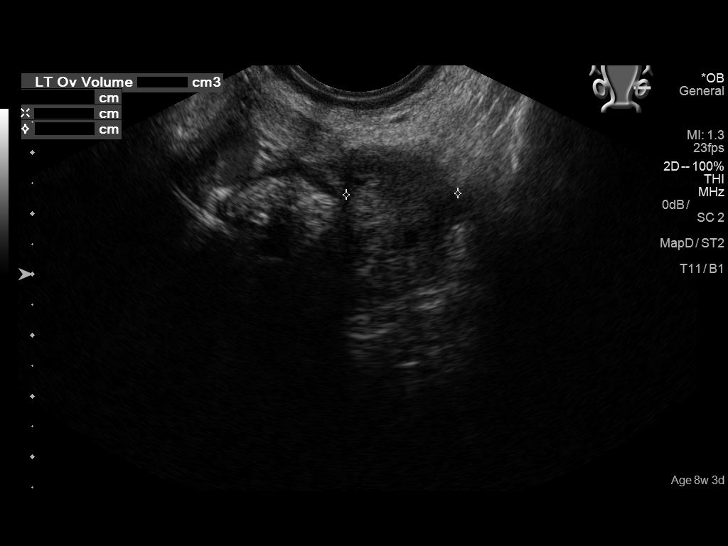

[14 of 28 positions shown; findings below may reference images not displayed]

FINDINGS: Intrauterine gestational sac: Single

Yolk sac:  Visualized

Embryo:  Visualized

Cardiac Activity: Visualized

Heart Rate: 163  bpm

CRL:  17.9  mm   8 w   2 d                  US EDC: 07/11/2017

Subchorionic hemorrhage:  None visualized.

Maternal uterus/adnexae: Normal appearing uterus and ovaries.
IMPRESSION: 1. Single live intrauterine 8 week 2 day gestation.
2. No subchorionic hemorrhage.

## 2018-11-28 ENCOUNTER — Encounter: Payer: Medicaid Other | Admitting: Obstetrics and Gynecology

## 2019-07-29 ENCOUNTER — Other Ambulatory Visit: Payer: Self-pay | Admitting: Family Medicine

## 2019-07-29 DIAGNOSIS — R1011 Right upper quadrant pain: Secondary | ICD-10-CM

## 2019-08-05 ENCOUNTER — Ambulatory Visit
Admission: RE | Admit: 2019-08-05 | Discharge: 2019-08-05 | Disposition: A | Discharge status: Home / Self Care | Payer: Medicaid Other | Source: Ambulatory Visit | Attending: Family Medicine | Admitting: Family Medicine

## 2019-08-05 ENCOUNTER — Other Ambulatory Visit: Payer: Self-pay

## 2019-08-05 DIAGNOSIS — R1011 Right upper quadrant pain: Secondary | ICD-10-CM | POA: Insufficient documentation

## 2019-08-17 ENCOUNTER — Other Ambulatory Visit: Payer: Self-pay

## 2019-08-17 ENCOUNTER — Encounter: Payer: Self-pay | Admitting: Surgery

## 2019-08-17 ENCOUNTER — Ambulatory Visit: Payer: Medicaid Other | Admitting: Surgery

## 2019-08-17 VITALS — Ht 64.0 in | Wt 209.6 lb

## 2019-08-17 DIAGNOSIS — K8020 Calculus of gallbladder without cholecystitis without obstruction: Secondary | ICD-10-CM

## 2019-08-17 NOTE — Progress Notes (Signed)
08/17/2019  Reason for Visit:  Symptomatic cholelithiasis  Referring Provider:  Dr. Delight Stare  History of Present Illness: Carolyn Lynch is a 34 y.o. female presenting for evaluation of cholelithiasis.  Patient reports that earlier in the month she had an episode of RUQ abdominal pain, radiating to the back and right shoulder.  The pain lasted about 3 days and she presented to [facility] center for evaluation.  An U/S was ordered which was done about a week later.  By then, her pain had resolved and had no symptoms.  Her U/S showed cholelithiasis, but without any evidence of cholecystitis.  Given the findings, she was referred to [place].  She reports now that she does not have any pain anymore, but she does feel bloated after eating sometimes.    Past Medical History: Past Medical History:  Diagnosis Date  . Vitamin D deficiency      Past Surgical History: Past Surgical History:  Procedure Laterality Date  . WISDOM TOOTH EXTRACTION      Home Medications: Prior to Admission medications   Not on File    Allergies: No Known Allergies  Social History:  reports that she has never smoked. She has never used smokeless tobacco. She reports that she does not drink alcohol or use drugs.   Family History: Family History  Problem Relation Age of Onset  . Diabetes Mother   . Hyperlipidemia Mother   . Stroke Mother   . Hypertension Father   . Cancer Paternal Grandmother   . Cancer Paternal Grandfather     Review of Systems: Review of Systems  Constitutional: Negative for chills and fever.  Respiratory: Negative for shortness of breath.   Cardiovascular: Negative for chest pain.  Gastrointestinal: Positive for abdominal pain. Negative for nausea and vomiting.  Genitourinary: Negative for dysuria.  Musculoskeletal: Negative for myalgias.  Skin: Negative for rash.  Neurological: Negative for dizziness.  Psychiatric/Behavioral: Negative for depression.    Physical  Exam Ht 5\' 4"  (1.626 m)   Wt 209 lb 9.6 oz (95.1 kg)   BMI 35.98 kg/m  CONSTITUTIONAL: No acute distress HEENT:  Normocephalic, atraumatic, extraocular motion intact. NECK: Trachea is midline, and there is no jugular venous distension.  RESPIRATORY:  Lungs are clear, and breath sounds are equal bilaterally. Normal respiratory effort without pathologic use of accessory muscles. CARDIOVASCULAR: Heart is regular without murmurs, gallops, or rubs. GI: The abdomen is soft, obese, non-distended, non-tender to palpation.  Negative Murphy's sign.  MUSCULOSKELETAL:  Normal muscle strength and tone in all four extremities.  No peripheral edema or cyanosis. SKIN: Skin turgor is normal. There are no pathologic skin lesions.  NEUROLOGIC:  Motor and sensation is grossly normal.  Cranial nerves are grossly intact. PSYCH:  Alert and oriented to person, place and time. Affect is normal.  Laboratory Analysis: No results found for this or any previous visit (from the past 24 hour(s)).  Imaging: U/S 08/05/19: IMPRESSION: 1. No acute findings. 2. Gallstones without evidence of acute cholecystitis. No bile duct dilation. 3. Liver appearance consistent with diffuse hepatic steatosis.   Assessment and Plan: This is a 34 y.o. female with symptomatic cholelithiasis.  Discussed with the patient more about her gallbladder, her having cholelithiasis, and that given that, she is at risk of having further episodes of biliary colic.  However, I cannot tell her when the next episode will occur.  This is her first and only episode of abdominal pain.  Discussed with her the option for diet changes with  a low fat diet, and that this may help her symptoms but cannot guarantee that she would be pain free in the future.  She is willing to try a low fat diet first, and if this does not work or help, she will call us back to discuss surgery instead.  I think that's reasonable given that this is her first episode and there  were no concerning findings on her ultrasound.  Will follow up as needed.  Face-to-face time spent with the patient and care providers was 40 minutes, with more than 50% of the time spent counseling, educating, and coordinating care of the patient.     Howie Ill, MD [facility]

## 2019-08-17 NOTE — Patient Instructions (Addendum)
If you decide to move forward with having the cholecystectomy please call our office and let us know.   Gallbladder Eating Plan If you have a gallbladder condition, you may have trouble digesting fats. Eating a low-fat diet can help reduce your symptoms, and may be helpful before and after having surgery to remove your gallbladder (cholecystectomy). Your health care provider may recommend that you work with a diet and nutrition specialist (dietitian) to help you reduce the amount of fat in your diet. What are tips for following this plan? General guidelines  Limit your fat intake to less than 30% of your total daily calories. If you eat around 1,800 calories each day, this is less than 60 grams (g) of fat per day.  Fat is an important part of a healthy diet. Eating a low-fat diet can make it hard to maintain a healthy body weight. Ask your dietitian how much fat, calories, and other nutrients you need each day.  Eat small, frequent meals throughout the day instead of three large meals.  Drink at least 8-10 cups of fluid a day. Drink enough fluid to keep your urine clear or pale yellow.  Limit alcohol intake to no more than 1 drink a day for nonpregnant women and 2 drinks a day for men. One drink equals 12 oz of beer, 5 oz of wine, or 1 oz of hard liquor. Reading food labels  Check Nutrition Facts on food labels for the amount of fat per serving. Choose foods with less than 3 grams of fat per serving. Shopping  Choose nonfat and low-fat healthy foods. Look for the words "nonfat," "low fat," or "fat free."  Avoid buying processed or prepackaged foods. Cooking  Cook using low-fat methods, such as baking, broiling, grilling, or boiling.  Cook with small amounts of healthy fats, such as olive oil, grapeseed oil, canola oil, or sunflower oil. What foods are recommended?   All fresh, frozen, or canned fruits and vegetables.  Whole grains.  Low-fat or non-fat (skim) milk and  yogurt.  Lean meat, skinless poultry, fish, eggs, and beans.  Low-fat protein supplement powders or drinks.  Spices and herbs. What foods are not recommended?  High-fat foods. These include baked goods, fast food, fatty cuts of meat, ice cream, french toast, sweet rolls, pizza, cheese bread, foods covered with butter, creamy sauces, or cheese.  Fried foods. These include french fries, tempura, battered fish, breaded chicken, fried breads, and sweets.  Foods with strong odors.  Foods that cause bloating and gas. Summary  A low-fat diet can be helpful if you have a gallbladder condition, or before and after gallbladder surgery.  Limit your fat intake to less than 30% of your total daily calories. This is about 60 g of fat if you eat 1,800 calories each day.  Eat small, frequent meals throughout the day instead of three large meals. This information is not intended to replace advice given to you by your health care provider. Make sure you discuss any questions you have with your health care provider. Document Revised: 10/02/2018 Document Reviewed: 07/19/2016 Elsevier Patient Education  2020 [employer].  Cholelithiasis  Cholelithiasis is also called "gallstones." It is a kind of gallbladder disease. The gallbladder is an organ that stores a liquid (bile) that helps you digest fat. Gallstones may not cause symptoms (may be silent gallstones) until they cause a blockage, and then they can cause pain (gallbladder attack). Follow these instructions at home:  Take over-the-counter and prescription medicines only as  told by your doctor.  Stay at a healthy weight.  Eat healthy foods. This includes: ? Eating fewer fatty foods, like fried foods. ? Eating fewer refined carbs (refined carbohydrates). Refined carbs are breads and grains that are highly processed, like white bread and white rice. Instead, choose whole grains like whole-wheat bread and brown rice. ? Eating more fiber.  Almonds, fresh fruit, and beans are healthy sources of fiber.  Keep all follow-up visits as told by your doctor. This is important. Contact a doctor if:  You have sudden pain in the upper right side of your belly (abdomen). Pain might spread to your right shoulder or your chest. This may be a sign of a gallbladder attack.  You feel sick to your stomach (are nauseous).  You throw up (vomit).  You have been diagnosed with gallstones that have no symptoms and you get: ? Belly pain. ? Discomfort, burning, or fullness in the upper part of your belly (indigestion). Get help right away if:  You have sudden pain in the upper right side of your belly, and it lasts for more than 2 hours.  You have belly pain that lasts for more than 5 hours.  You have a fever or chills.  You keep feeling sick to your stomach or you keep throwing up.  Your skin or the whites of your eyes turn yellow (jaundice).  You have dark-colored pee (urine).  You have light-colored poop (stool). Summary  Cholelithiasis is also called "gallstones."  The gallbladder is an organ that stores a liquid (bile) that helps you digest fat.  Silent gallstones are gallstones that do not cause symptoms.  A gallbladder attack may cause sudden pain in the upper right side of your belly. Pain might spread to your right shoulder or your chest. If this happens, contact your doctor.  If you have sudden pain in the upper right side of your belly that lasts for more than 2 hours, get help right away. This information is not intended to replace advice given to you by your health care provider. Make sure you discuss any questions you have with your health care provider. Document Revised: 05/24/2017 Document Reviewed: 02/26/2016 Elsevier Patient Education  2020 [employer].

## 2019-10-02 ENCOUNTER — Other Ambulatory Visit: Payer: Self-pay

## 2019-10-02 ENCOUNTER — Encounter: Payer: Self-pay | Admitting: Dermatology

## 2019-10-02 ENCOUNTER — Ambulatory Visit (INDEPENDENT_AMBULATORY_CARE_PROVIDER_SITE_OTHER): Payer: Medicaid Other | Admitting: Dermatology

## 2019-10-02 DIAGNOSIS — B079 Viral wart, unspecified: Secondary | ICD-10-CM

## 2019-10-02 NOTE — Progress Notes (Signed)
   Follow-Up Visit   Subjective  Carolyn Lynch is a 34 y.o. female who presents for the following: Warts (warts on the hands have resolved and the warts on her foot have improved. ).  The following portions of the chart were reviewed this encounter and updated as appropriate: Tobacco  Allergies  Meds  Problems  Med Hx  Surg Hx  Fam Hx     Review of Systems: No other skin or systemic complaints.  Objective  Well appearing patient in no apparent distress; mood and affect are within normal limits.  A focused examination was performed including the L foot and B/L hands. Relevant physical exam findings are noted in the Assessment and Plan.  Objective  L heel x 1: Verrucous papule  Assessment & Plan  Viral warts, unspecified type L heel x 1  Cantharone Plus applied today then covered with a bandage.   Destruction of lesion - L heel x 1  Destruction method: chemical removal   Informed consent: discussed and consent obtained   Timeout:  patient name, date of birth, surgical site, and procedure verified Chemical destruction method: cantharidin   Procedure instructions: patient instructed to wash and dry area   Outcome: patient tolerated procedure well with no complications   Post-procedure details: wound care instructions given   Additional details:  Expected blistering reaction reviewed prior to application. Patient advised to set alarm to remind them to wash off with soap and water at the directed time.  Return in about 1 month (around 11/01/2019).   Maylene Roes, CMA, am acting as scribe for Darden Dates, MD .  Documentation: I have reviewed the above documentation for accuracy and completeness, and I agree with the above.  Darden Dates, MD

## 2019-10-08 ENCOUNTER — Encounter: Payer: Self-pay | Admitting: Dermatology

## 2019-10-10 ENCOUNTER — Other Ambulatory Visit: Payer: Self-pay

## 2019-10-10 ENCOUNTER — Ambulatory Visit
Admission: EM | Admit: 2019-10-10 | Discharge: 2019-10-10 | Disposition: A | Discharge status: Home / Self Care | Payer: Medicaid Other | Attending: Emergency Medicine | Admitting: Emergency Medicine

## 2019-10-10 DIAGNOSIS — F411 Generalized anxiety disorder: Secondary | ICD-10-CM

## 2019-10-10 DIAGNOSIS — R079 Chest pain, unspecified: Secondary | ICD-10-CM

## 2019-10-10 MED ORDER — HYDROXYZINE HCL 25 MG PO TABS: 25.0000 mg | ORAL_TABLET | Freq: Four times a day (QID) | ORAL | 0 refills | Status: DC | PRN

## 2019-10-10 NOTE — Discharge Instructions (Addendum)
Unable to rule out cardiac disease or blood clot in urgent care setting.  Offered patient further evaluation and management in the ED.  Patient declines at this time and would like to try outpatient therapy first.  Aware of the risk associated with this decision including missed diagnosis, organ damage, organ failure, and/or death.  Patient aware and in agreement.     Rest and drink fluids Eat a well-balanced diet, and avoid excessive caffeine intake Hydroxyzine prescribed.  Use as prescribed for anxiety.  Do not take this medication while breast feeding Some things you may try doing to help alleviate your symptoms include: keeping a journal, exercise, talking to a friend or relative, listening to music, going for a walk or hike outside, or other activities that you may find enjoyable Follow up with PCP next week for recheck and for further evaluation and management Return or go to ER if you have any new or worsening symptoms such as fever, chills, fatigue, worsening shortness of breath, wheezing, chest pain, nausea, vomiting, abdominal pain, changes in bowel or bladder habits, etc..Marland Kitchen
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
## 2019-10-10 NOTE — ED Triage Notes (Signed)
About 4 days ago she started having chest pain, felt dizzy and like she was going to pass out. It subsided with in few minutes. Pain is right in the middle of chest and describes it as heaviness. Pain comes and goes.

## 2019-10-30 ENCOUNTER — Ambulatory Visit: Payer: Medicaid Other | Admitting: Dermatology

## 2019-11-12 ENCOUNTER — Other Ambulatory Visit: Payer: Self-pay | Admitting: Family Medicine

## 2019-11-12 DIAGNOSIS — Z1231 Encounter for screening mammogram for malignant neoplasm of breast: Secondary | ICD-10-CM

## 2020-02-10 ENCOUNTER — Other Ambulatory Visit: Payer: Self-pay

## 2020-02-10 DIAGNOSIS — R0789 Other chest pain: Secondary | ICD-10-CM | POA: Insufficient documentation

## 2020-02-11 ENCOUNTER — Emergency Department (HOSPITAL_COMMUNITY): Payer: Medicaid Other

## 2020-02-11 ENCOUNTER — Other Ambulatory Visit: Payer: Self-pay

## 2020-02-11 ENCOUNTER — Emergency Department (HOSPITAL_COMMUNITY)
Admission: EM | Admit: 2020-02-11 | Discharge: 2020-02-11 | Disposition: A | Discharge status: Home / Self Care | Payer: Medicaid Other | Attending: Emergency Medicine | Admitting: Emergency Medicine

## 2020-02-11 ENCOUNTER — Encounter (HOSPITAL_COMMUNITY): Payer: Self-pay | Admitting: Emergency Medicine

## 2020-02-11 DIAGNOSIS — R0789 Other chest pain: Secondary | ICD-10-CM

## 2020-02-11 LAB — CBC
HCT: 37.2 % (ref 36.0–46.0)
Hemoglobin: 12.7 g/dL (ref 12.0–15.0)
MCH: 29.5 pg (ref 26.0–34.0)
MCHC: 34.1 g/dL (ref 30.0–36.0)
MCV: 86.5 fL (ref 80.0–100.0)
Platelets: 234 10*3/uL (ref 150–400)
RBC: 4.3 MIL/uL (ref 3.87–5.11)
RDW: 12.9 % (ref 11.5–15.5)
WBC: 5.4 10*3/uL (ref 4.0–10.5)
nRBC: 0 % (ref 0.0–0.2)

## 2020-02-11 LAB — BASIC METABOLIC PANEL
Anion gap: 10 (ref 5–15)
BUN: 15 mg/dL (ref 6–20)
CO2: 23 mmol/L (ref 22–32)
Calcium: 9.3 mg/dL (ref 8.9–10.3)
Chloride: 104 mmol/L (ref 98–111)
Creatinine, Ser: 0.49 mg/dL (ref 0.44–1.00)
GFR calc Af Amer: >60 mL/min (ref >60)
GFR calc non Af Amer: >60 mL/min (ref >60)
Glucose, Bld: 97 mg/dL (ref 70–99)
Potassium: 3.8 mmol/L (ref 3.5–5.1)
Sodium: 137 mmol/L (ref 135–145)

## 2020-02-11 LAB — TROPONIN I (HIGH SENSITIVITY)
Troponin I (High Sensitivity): 2 ng/L (ref <18)
Troponin I (High Sensitivity): <2 ng/L (ref <18)

## 2020-02-11 NOTE — ED Triage Notes (Signed)
Pt states she has been having chest pain on and off for the past 4 days. Pt also states she has been to her PCP multiple times in the past month for multiple complaints.

## 2020-02-11 NOTE — Discharge Instructions (Signed)
Take ibuprofen every 6-8 hours as needed for your pain.  Follow-up with your primary doctor if symptoms continue.  Return to the ER for significantly worsening pain or shortness of breath.
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
## 2020-02-17 ENCOUNTER — Ambulatory Visit (INDEPENDENT_AMBULATORY_CARE_PROVIDER_SITE_OTHER): Payer: Medicaid Other | Admitting: Dermatology

## 2020-02-17 ENCOUNTER — Encounter: Payer: Self-pay | Admitting: Dermatology

## 2020-02-17 ENCOUNTER — Other Ambulatory Visit: Payer: Self-pay

## 2020-02-17 DIAGNOSIS — B079 Viral wart, unspecified: Secondary | ICD-10-CM | POA: Diagnosis not present

## 2020-02-17 NOTE — Progress Notes (Signed)
   Follow-Up Visit   Subjective  Carolyn Lynch is a 34 y.o. female who presents for the following: Follow-up (warts).  Patient presents today for follow up on OV 10/02/19 for wart on plantar feet treated with Catherone Plus.  The following portions of the chart were reviewed this encounter and updated as appropriate:  Tobacco  Allergies  Meds  Problems  Med Hx  Surg Hx  Fam Hx      Review of Systems:  No other skin or systemic complaints except as noted in HPI or Assessment and Plan.  Objective  Well appearing patient in no apparent distress; mood and affect are within normal limits.  A focused examination was performed including B/L Plantar foot. Relevant physical exam findings are noted in the Assessment and Plan.  Objective  B/L Plantar Foot (3): Verrucous papules    Assessment & Plan  Viral warts, unspecified type (3) B/L Plantar Foot  Catherone Plus applied today  Discussed viral etiology and risk of spread.  Discussed multiple treatments may be required to clear warts.  Discussed possible post-treatment dyspigmentation and risk of recurrence.   Destruction of lesion - B/L Plantar Foot  Destruction method: chemical removal   Informed consent: discussed and consent obtained   Timeout:  patient name, date of birth, surgical site, and procedure verified Chemical destruction method: cantharidin   Procedure instructions: patient instructed to wash and dry area   Outcome: patient tolerated procedure well with no complications   Post-procedure details: wound care instructions given   Additional details:  Wash off in 4 to 6 hours  Return in about 1 month (around 03/19/2020) for Wart.   ILeward Quan, CMA, am acting as scribe for Darden Dates, MD .  Documentation: I have reviewed the above documentation for accuracy and completeness, and I agree with the above.  Darden Dates, MD

## 2020-02-17 NOTE — Patient Instructions (Addendum)
Cantharidin is a blistering agent that comes from a beetle.  It needs to be washed off in about 4 hours after application.  Although it is painless when applied in office, it may cause symptoms of mild pain and burning several hours later.  Treated areas will swell and turn red, and blisters may form.  Vaseline and a bandaid may be applied until wound has healed.  Once healed, the skin may remain temporarily discolored.  It can take weeks to months for pigmentation to return to normal.  The molluscum may resolve with this topical treatment, but often, additional treatments may be required to clear molluscum.  It is recommended to keep the skin well-moisturized and avoid scratching affected area to help prevent spread of the molluscum.  Instructions for After In-Office Application of Cantharidin  1. This is a strong medicine; please follow ALL instructions.  2. Gently wash off with soap and water in four hours or sooner s directed by your physician.  3. **WARNING** this medicine can cause severe blistering, blood blisters, infection, and/or scarring if it is not washed off as directed.  4. Your progress will be rechecked in 1-2 months; call sooner if there are any questions or problems.   Discussed viral etiology and risk of spread.  Discussed multiple treatments may be required to clear warts.  Discussed possible post-treatment dyspigmentation and risk of recurrence.

## 2020-02-23 ENCOUNTER — Encounter: Payer: Self-pay | Admitting: Dermatology

## 2020-03-29 ENCOUNTER — Other Ambulatory Visit: Payer: Self-pay

## 2020-03-29 ENCOUNTER — Ambulatory Visit (INDEPENDENT_AMBULATORY_CARE_PROVIDER_SITE_OTHER): Payer: Medicaid Other | Admitting: Dermatology

## 2020-03-29 DIAGNOSIS — B078 Other viral warts: Secondary | ICD-10-CM | POA: Diagnosis not present

## 2020-03-29 NOTE — Patient Instructions (Signed)
Instructions for After In-Office Application of Cantharidin  1. This is a strong medicine; please follow ALL instructions. 2. Gently wash off with soap and water in four hours or sooner s directed by your physician. 3. **WARNING** this medicine can cause severe blistering, blood blisters, infection, and/or scarring if it is not washed off as directed 4. Your progress will be rechecked in 1-2 months; call sooner if there are any questions or problems.   

## 2020-03-29 NOTE — Progress Notes (Signed)
   Follow-Up Visit   Subjective  Carolyn Lynch is a 34 y.o. female who presents for the following: Warts (Patient here today for 1 month wart follow up at bottom of both feet. ).  Treating with cantharone plus, treated in the past LN2 Patient feels like they have improved. .   The following portions of the chart were reviewed this encounter and updated as appropriate:  Tobacco  Allergies  Meds  Problems  Med Hx  Surg Hx  Fam Hx      Review of Systems:  No other skin or systemic complaints except as noted in HPI or Assessment and Plan.  Objective  Well appearing patient in no apparent distress; mood and affect are within normal limits.  A focused examination was performed including feet. Relevant physical exam findings are noted in the Assessment and Plan.  Objective  Left Plantar Foot x 2, right plantar foot x 4 (6): Verrucous papules   Assessment & Plan  Other viral warts (6) Left Plantar Foot x 2, right plantar foot x 4  Discussed viral etiology and risk of spread.  Discussed multiple treatments may be required to clear warts.  Discussed possible post-treatment dyspigmentation and risk of recurrence.  Cantharone plus applied.  Cantharone is a blistering agent that comes from a beetle.  It needs to be washed off in about 4 hours after application.  Although it is painless when applied in office, it may cause symptoms of mild pain and burning several hours later.  Treated areas will swell and turn red, and blisters may form.  Vaseline and a bandaid may be applied until wound has healed.  Once healed, the skin may remain temporarily discolored.    Advised to wash off with soap and water in 4 hours  Destruction of lesion - Left Plantar Foot x 2, right plantar foot x 4  Destruction method: chemical removal   Informed consent: discussed and consent obtained   Timeout:  patient name, date of birth, surgical site, and procedure verified Chemical destruction method: cantharidin    Application time:  4 hours Procedure instructions: patient instructed to wash and dry area   Outcome: patient tolerated procedure well with no complications   Post-procedure details: wound care instructions given   Additional details:  Patient advised to set alarm to remind them to wash off with soap and water at the directed time.  Return in about 4 weeks (around 04/26/2020) for wart.  Anise Salvo, RMA, am acting as scribe for Darden Dates, MD .  Documentation: I have reviewed the above documentation for accuracy and completeness, and I agree with the above.  Darden Dates, MD

## 2020-04-13 ENCOUNTER — Ambulatory Visit: Payer: Self-pay | Admitting: Surgery

## 2020-04-17 ENCOUNTER — Encounter: Payer: Self-pay | Admitting: Dermatology

## 2020-05-06 ENCOUNTER — Ambulatory Visit (INDEPENDENT_AMBULATORY_CARE_PROVIDER_SITE_OTHER): Payer: Medicaid Other | Admitting: Surgery

## 2020-05-06 ENCOUNTER — Encounter: Payer: Self-pay | Admitting: Surgery

## 2020-05-06 ENCOUNTER — Other Ambulatory Visit: Payer: Self-pay

## 2020-05-06 VITALS — Ht 64.0 in | Wt 208.0 lb

## 2020-05-06 DIAGNOSIS — K8020 Calculus of gallbladder without cholecystitis without obstruction: Secondary | ICD-10-CM | POA: Diagnosis not present

## 2020-05-06 NOTE — Progress Notes (Signed)
  05/06/2020  History of Present Illness: Carolyn Lynch is a 34 y.o. female presenting for follow up of symptomatic cholelithiasis.  She was last seen on 08/17/19.  At that time we decided on conservative measures with dietary changes.  She reports that it has been hard to make and continue dietary changes with her two kids.  She did not have insurance before either.  Now she has Medicaid and she would like to proceed with surgery.  She reports that she's had milder episodes through the months and a more intense one recently about 2 weeks ago. Denies any current pain.  Denies any fevers, chills, chest pain, shortness of breath.  Past Medical History: Past Medical History:  Diagnosis Date  . Vitamin D deficiency      Past Surgical History: Past Surgical History:  Procedure Laterality Date  . WISDOM TOOTH EXTRACTION      Home Medications: Prior to Admission medications   Medication Sig Start Date End Date Taking? Authorizing Provider  hydrOXYzine (ATARAX/VISTARIL) 25 MG tablet Take 1 tablet (25 mg total) by mouth every 6 (six) hours as needed for anxiety. 10/10/19  Yes Wurst, [place], PA-C  Omega-3 Fatty Acids (FISH OIL) 1000 MG CAPS Take 1 capsule by mouth 2 (two) times daily. 02/03/20  Yes [provider]  Vitamin D, Ergocalciferol, (DRISDOL) 1.25 MG (50000 UNIT) CAPS capsule Take by mouth daily. 02/03/20  Yes [provider]    Allergies: No Known Allergies  Review of Systems: Review of Systems  Constitutional: Negative for chills and fever.  Respiratory: Negative for shortness of breath.   Cardiovascular: Negative for chest pain.  Gastrointestinal: Negative for abdominal pain, nausea and vomiting.    Physical Exam Ht 5\' 4"  (1.626 m)   Wt 208 lb (94.3 kg)   BMI 35.70 kg/m  CONSTITUTIONAL: No acute distress HEENT:  Normocephalic, atraumatic, extraocular motion intact. RESPIRATORY:  Normal respiratory effort without pathologic use of accessory  muscles. CARDIOVASCULAR: Regular rhythm and rate. GI: The abdomen is soft, obese, non-distended, currently non-tender to palpation.  Negative Murphy's sign. NEUROLOGIC:  Motor and sensation is grossly normal.  Cranial nerves are grossly intact. PSYCH:  Alert and oriented to person, place and time. Affect is normal.  Labs/Imaging: None recently  Assessment and Plan: This is a 34 y.o. female with symptomatic cholelithiasis.  --Discussed with the patient the role of minimally invasive, robotic cholecystectomy.  Discussed the procedure at length as well as the risks of bleeding, infection, injury to surrounding structures, and she's willing to proceed.  Discussed the use of ICG for cholangiogram to better visualize the biliary anatomy.  Also reviewed post-operative course, recovery time, activity restrictions.  She understands that she would need COVID-19 testing prior to surgery. --Patient would like to be scheduled after Thanksgiving for surgery.  We'll schedule her for 05/24/20.  Face-to-face time spent with the patient and care providers was 25 minutes, with more than 50% of the time spent counseling, educating, and coordinating care of the patient.     05/26/20, MD [facility]

## 2020-05-06 NOTE — Patient Instructions (Addendum)
You have requested to have your gallbladder removed. This will be done at [facility] with Dr. Aleen Campi.  You will most likely be out of work 1-2 weeks for this surgery. You will return after your post-op appointment with a lifting restriction for approximately 4 more weeks.  You will be able to eat anything you would like to following surgery. But, start by eating a bland diet and advance this as tolerated. The Gallbladder diet is below, please go as closely by this diet as possible prior to surgery to avoid any further attacks.  Please see the (blue)pre-care form that you have been given today. If you have any questions, please call our office. Our surgery scheduler will call you to look at surgery dates and go over information.   __________________________________________________________________________________  Laparoscopic Cholecystectomy Laparoscopic cholecystectomy is surgery to remove the gallbladder. The gallbladder is located in the upper right part of the abdomen, behind the liver. It is a storage sac for bile, which is produced in the liver. Bile aids in the digestion and absorption of fats. Cholecystectomy is often done for inflammation of the gallbladder (cholecystitis). This condition is usually caused by a buildup of gallstones (cholelithiasis) in the gallbladder. Gallstones can block the flow of bile, and that can result in inflammation and pain. In severe cases, emergency surgery may be required. If emergency surgery is not required, you will have time to prepare for the procedure. Laparoscopic surgery is an alternative to open surgery. Laparoscopic surgery has a shorter recovery time. Your common bile duct may also need to be examined during the procedure. If stones are found in the common bile duct, they may be removed. LET [facility] CARE PROVIDER KNOW ABOUT:  Any allergies you have.  All medicines you are taking, including vitamins, herbs, eye drops, creams, and  over-the-counter medicines.  Previous problems you or members of your family have had with the use of anesthetics.  Any blood disorders you have.  Previous surgeries you have had.    Any medical conditions you have. RISKS AND COMPLICATIONS Generally, this is a safe procedure. However, problems may occur, including:  Infection.  Bleeding.  Allergic reactions to medicines.  Damage to other structures or organs.  A stone remaining in the common bile duct.  A bile leak from the cyst duct that is clipped when your gallbladder is removed.  The need to convert to open surgery, which requires a larger incision in the abdomen. This may be necessary if your surgeon thinks that it is not safe to continue with a laparoscopic procedure. BEFORE THE PROCEDURE  Ask your health care provider about:  Changing or stopping your regular medicines. This is especially important if you are taking diabetes medicines or blood thinners.  Taking medicines such as aspirin and ibuprofen. These medicines can thin your blood. Do not take these medicines before your procedure if your health care provider instructs you not to.  Follow instructions from your health care provider about eating or drinking restrictions.  Let your health care provider know if you develop a cold or an infection before surgery.  Plan to have someone take you home after the procedure.  Ask your health care provider how your surgical site will be marked or identified.  You may be given antibiotic medicine to help prevent infection. PROCEDURE  To reduce your risk of infection:  Your health care team will wash or sanitize their hands.  Your skin will be washed with soap.  An IV tube may  be inserted into one of your veins.  You will be given a medicine to make you fall asleep (general anesthetic).  A breathing tube will be placed in your mouth.  The surgeon will make several small cuts (incisions) in your abdomen.  A  thin, lighted tube (laparoscope) that has a tiny camera on the end will be inserted through one of the small incisions. The camera on the laparoscope will send a picture to a TV screen (monitor) in the operating room. This will give the surgeon a good view inside your abdomen.  A gas will be pumped into your abdomen. This will expand your abdomen to give the surgeon more room to perform the surgery.  Other tools that are needed for the procedure will be inserted through the other incisions. The gallbladder will be removed through one of the incisions.  After your gallbladder has been removed, the incisions will be closed with stitches (sutures), staples, or skin glue.  Your incisions may be covered with a bandage (dressing). The procedure may vary among health care providers and hospitals. AFTER THE PROCEDURE  Your blood pressure, heart rate, breathing rate, and blood oxygen level will be monitored often until the medicines you were given have worn off.  You will be given medicines as needed to control your pain.   This information is not intended to replace advice given to you by your health care provider. Make sure you discuss any questions you have with your health care provider.   Document Released: 06/11/2005 Document Revised: 03/02/2015 Document Reviewed: 01/21/2013 Elsevier Interactive Patient Education 2016 Elsevier Inc.   Low-Fat Diet for Gallbladder Conditions A low-fat diet can be helpful if you have pancreatitis or a gallbladder condition. With these conditions, your pancreas and gallbladder have trouble digesting fats. A healthy eating plan with less fat will help rest your pancreas and gallbladder and reduce your symptoms. WHAT DO I NEED TO KNOW ABOUT THIS DIET?  Eat a low-fat diet.  Reduce your fat intake to less than 20-30% of your total daily calories. This is less than 50-60 g of fat per day.  Remember that you need some fat in your diet. Ask your dietician what your  daily goal should be.  Choose nonfat and low-fat healthy foods. Look for the words "nonfat," "low fat," or "fat free."  As a guide, look on the label and choose foods with less than 3 g of fat per serving. Eat only one serving.  Avoid alcohol.  Do not smoke. If you need help quitting, talk with your health care provider.  Eat small frequent meals instead of three large heavy meals. WHAT FOODS CAN I EAT? Grains Include healthy grains and starches such as potatoes, wheat bread, fiber-rich cereal, and brown rice. Choose whole grain options whenever possible. In adults, whole grains should account for 45-65% of your daily calories.  Fruits and Vegetables Eat plenty of fruits and vegetables. Fresh fruits and vegetables add fiber to your diet. Meats and Other Protein Sources Eat lean meat such as chicken and pork. Trim any fat off of meat before cooking it. Eggs, fish, and beans are other sources of protein. In adults, these foods should account for 10-35% of your daily calories. Dairy Choose low-fat milk and dairy options. Dairy includes fat and protein, as well as calcium.  Fats and Oils Limit high-fat foods such as fried foods, sweets, baked goods, sugary drinks.  Other Creamy sauces and condiments, such as mayonnaise, can add extra fat. Think about  whether or not you need to use them, or use smaller amounts or low fat options. WHAT FOODS ARE NOT RECOMMENDED?  High fat foods, such as:  [employer].  Ice cream.  [place] toast.  Sweet rolls.  Pizza.  Cheese bread.  Foods covered with batter, butter, creamy sauces, or cheese.  Fried foods.  Sugary drinks and desserts.  Foods that cause gas or bloating   This information is not intended to replace advice given to you by your health care provider. Make sure you discuss any questions you have with your health care provider.   Document Released: 06/16/2013 Document Reviewed: 06/16/2013 Elsevier Interactive Patient Education  [employer]

## 2020-05-09 ENCOUNTER — Telehealth: Payer: Self-pay | Admitting: Surgery

## 2020-05-09 NOTE — Telephone Encounter (Signed)
Outgoing call is made, left message for patient to call.  Please advise patient of Pre-Admission date/time, COVID Testing date and Surgery date.  Surgery Date: 06/02/20 Preadmission Testing Date: 05/26/20 (phone 8a 1p- Covid Testing Date: 05/31/20 - patient [facility] [facility]) between 8a-1p   Also patient needs to call [phone], between 1-3:00pm the day before surgery, to find out what time to arrive for surgery.

## 2020-05-09 NOTE — Telephone Encounter (Signed)
Incoming call from patient, she is now informed of all dates regarding her surgery and voices understanding.

## 2020-05-18 ENCOUNTER — Other Ambulatory Visit: Payer: Self-pay

## 2020-05-18 ENCOUNTER — Ambulatory Visit (INDEPENDENT_AMBULATORY_CARE_PROVIDER_SITE_OTHER): Payer: Medicaid Other | Admitting: Dermatology

## 2020-05-18 ENCOUNTER — Encounter: Payer: Self-pay | Admitting: Dermatology

## 2020-05-18 DIAGNOSIS — B070 Plantar wart: Secondary | ICD-10-CM | POA: Diagnosis not present

## 2020-05-18 NOTE — Progress Notes (Signed)
   Follow-Up Visit   Subjective  Carolyn Lynch is a 34 y.o. female who presents for the following: Warts (Recheck. Bilateral feet. Tx with LN2).   The following portions of the chart were reviewed this encounter and updated as appropriate: Tobacco  Allergies  Meds  Problems  Med Hx  Surg Hx  Fam Hx      Review of Systems: No other skin or systemic complaints except as noted in HPI or Assessment and Plan.  Objective  Well appearing patient in no apparent distress; mood and affect are within normal limits.  A focused examination was performed including face, feet. Relevant physical exam findings are noted in the Assessment and Plan.  Objective  Right 3rd Metatarsal Plantar Area x1, left plantar foot x1 (2): Verrucous papules -- Discussed viral etiology and contagion.   Assessment & Plan  Plantar wart (2) Right 3rd Metatarsal Plantar Area x1, left plantar foot x1  Discussed viral etiology and risk of spread.  Discussed multiple treatments may be required to clear warts.  Discussed possible post-treatment dyspigmentation and risk of recurrence.  Cantharidin is a blistering agent that comes from a beetle.  It needs to be washed off in about 4 hours after application.  Although it is painless when applied in office, it may cause symptoms of mild pain and burning several hours later.  Treated areas will swell and turn red, and blisters may form.  Vaseline and a bandaid may be applied until wound has healed.  Once healed, the skin may remain temporarily discolored.  It can take weeks to months for pigmentation to return to normal.  Destruction of lesion - Right 3rd Metatarsal Plantar Area x1, left plantar foot x1  Destruction method: chemical removal   Destruction method comment:  Cantharone plus Informed consent: discussed and consent obtained   Timeout:  patient name, date of birth, surgical site, and procedure verified Chemical destruction method: cantharidin   Procedure  instructions: patient instructed to wash and dry area   Outcome: patient tolerated procedure well with no complications   Post-procedure details: wound care instructions given   Additional details:  Wash off with soap and water in 4 hours or sooner if tender before then  Return in about 4 weeks (around 06/15/2020) for wart recheck.   I, Lawson Radar, CMA, am acting as scribe for Darden Dates, MD.  Documentation: I have reviewed the above documentation for accuracy and completeness, and I agree with the above.  Darden Dates, MD

## 2020-05-18 NOTE — Patient Instructions (Addendum)
Instructions for After In-Office Application of Cantharidin  1. This is a strong medicine; please follow ALL instructions. 2. Gently wash off with soap and water in four hours or sooner s directed by your physician. 3. **WARNING** this medicine can cause severe blistering, blood blisters, infection, and/or scarring if it is not washed off as directed 4. Your progress will be rechecked in 1-2 months; call sooner if there are any questions or problems.   

## 2020-05-23 ENCOUNTER — Encounter: Payer: Self-pay | Admitting: Dermatology

## 2020-05-26 ENCOUNTER — Other Ambulatory Visit: Admission: RE | Admit: 2020-05-26 | Payer: Medicaid Other | Source: Ambulatory Visit

## 2020-05-30 ENCOUNTER — Encounter
Admission: RE | Admit: 2020-05-30 | Discharge: 2020-05-30 | Disposition: A | Discharge status: Home / Self Care | Payer: Medicaid Other | Source: Ambulatory Visit | Attending: Surgery | Admitting: Surgery

## 2020-05-30 ENCOUNTER — Other Ambulatory Visit: Payer: Self-pay

## 2020-05-30 DIAGNOSIS — Z01818 Encounter for other preprocedural examination: Secondary | ICD-10-CM | POA: Insufficient documentation

## 2020-05-30 DIAGNOSIS — R079 Chest pain, unspecified: Secondary | ICD-10-CM | POA: Diagnosis not present

## 2020-05-30 HISTORY — DX: Gastro-esophageal reflux disease without esophagitis: K21.9

## 2020-05-30 HISTORY — DX: Anxiety disorder, unspecified: F41.9

## 2020-05-30 HISTORY — DX: Unspecified convulsions: R56.9

## 2020-05-30 NOTE — Patient Instructions (Signed)
Your procedure is scheduled on:06-02-20 THURSDAY Report to the Registration Desk on the 1st floor of the [facility]. To find out your arrival time, please call [phone] between 1PM - 3PM on:06-01-20 WEDNESDAY  REMEMBER: Instructions that are not followed completely may result in serious medical risk, up to and including death; or upon the discretion of your surgeon and anesthesiologist your surgery may need to be rescheduled.  Do not eat food after midnight the night before surgery.  No gum chewing, lozengers or hard candies.  You may however, drink CLEAR liquids up to 2 hours before you are scheduled to arrive for your surgery. Do not drink anything within 2 hours of your scheduled arrival time.  Clear liquids include: - water  - apple juice without pulp - gatorade (not RED, PURPLE, OR BLUE) - black coffee or tea (Do NOT add milk or creamers to the coffee or tea) Do NOT drink anything that is not on this list.  TAKE THESE MEDICATIONS THE MORNING OF SURGERY WITH A SIP OF WATER: -NONE  One week prior to surgery: Stop Anti-inflammatories (NSAIDS) such as Advil, Aleve, Ibuprofen, Motrin, Naproxen, Naprosyn and Aspirin based products such as Excedrin, Goodys Powder, BC Powder-OK TO TAKE TYLENOL IF NEEDED  Stop ANY OVER THE COUNTER supplements until after surgery-STOP YOUR HAIR,SKIN AND NAILS (BIOTIN) AND FISH OIL NOW-YOU MAY RESUME AFTER SURGERY (However, you may continue taking Vitamin D up until the day before surgery.)  No Alcohol for 24 hours before or after surgery.  No Smoking including e-cigarettes for 24 hours prior to surgery.  No chewable tobacco products for at least 6 hours prior to surgery.  No nicotine patches on the day of surgery.  Do not use any "recreational" drugs for at least a week prior to your surgery.  Please be advised that the combination of cocaine and anesthesia may have negative outcomes, up to and including death. If you test positive for cocaine,  your surgery will be cancelled.  On the morning of surgery brush your teeth with toothpaste and water, you may rinse your mouth with mouthwash if you wish. Do not swallow any toothpaste or mouthwash.  Do not wear jewelry, make-up, hairpins, clips or nail polish.  Do not wear lotions, powders, or perfumes.   Do not shave body from the neck down 48 hours prior to surgery just in case you cut yourself which could leave a site for infection.  Also, freshly shaved skin may become irritated if using the CHG soap.  Contact lenses, hearing aids and dentures may not be worn into surgery.  Do not bring valuables to the hospital. [facility] is not responsible for any missing/lost belongings or valuables.   Use CHG Soap as directed on instruction sheet.  Notify your doctor if there is any change in your medical condition (cold, fever, infection).  Wear comfortable clothing (specific to your surgery type) to the hospital.  Plan for stool softeners for home use; pain medications have a tendency to cause constipation. You can also help prevent constipation by eating foods high in fiber such as fruits and vegetables and drinking plenty of fluids as your diet allows.  After surgery, you can help prevent lung complications by doing breathing exercises.  Take deep breaths and cough every 1-2 hours. Your doctor may order a device called an Incentive Spirometer to help you take deep breaths. When coughing or sneezing, hold a pillow firmly against your incision with both hands. This is called "splinting." Doing this  helps protect your incision. It also decreases belly discomfort.  If you are being admitted to the hospital overnight, leave your suitcase in the car. After surgery it may be brought to your room.  If you are being discharged the day of surgery, you will not be allowed to drive home. You will need a responsible adult (18 years or older) to drive you home and stay with you that night.   If  you are taking public transportation, you will need to have a responsible adult (18 years or older) with you. Please confirm with your physician that it is acceptable to use public transportation.   Please call the Pre-admissions Testing Dept. at [phone] if you have any questions about these instructions.  Visitation Policy:  Patients undergoing a surgery or procedure may have one family member or support person with them as long as that person is not COVID-19 positive or experiencing its symptoms.  That person may remain in the waiting area during the procedure.  Inpatient Visitation Update:   In an effort to ensure the safety of our team members and our patients, we are implementing a change to our visitation policy:  Effective Monday, Aug. 9, at 7 a.m., inpatients will be allowed one support person.  o The support person may change daily.  o The support person must pass our screening, gel in and out, and wear a mask at all times, including in the patient's room.  o Patients must also wear a mask when staff or their support person are in the room.  o Masking is required regardless of vaccination status.  Systemwide, no visitors 17 or younger.

## 2020-05-31 ENCOUNTER — Telehealth: Payer: Self-pay | Admitting: Surgery

## 2020-05-31 ENCOUNTER — Other Ambulatory Visit: Payer: Medicaid Other

## 2020-05-31 NOTE — Telephone Encounter (Signed)
Incoming call just received from the patient.  She is cancelling her surgery for 06/02/20.  When asked why, she states having difficulty with child care.  I suggested to her that we can reschedule at a later date for her but patient declining to reschedule.  At patient's request surgery is cancelled for 06/02/20 and pre-admit is also informed and cancelled her Covid test for today, 05/31/20.  If patient decides at a later date to reschedule surgery, will bring her in for follow up with doctor.

## 2020-06-02 ENCOUNTER — Ambulatory Visit: Admission: RE | Admit: 2020-06-02 | Payer: Medicaid Other | Source: Home / Self Care | Admitting: Surgery

## 2020-06-02 ENCOUNTER — Encounter: Admission: RE | Payer: Self-pay | Source: Home / Self Care

## 2020-06-02 SURGERY — CHOLECYSTECTOMY, ROBOT-ASSISTED, LAPAROSCOPIC
Anesthesia: General

## 2020-06-08 ENCOUNTER — Ambulatory Visit: Payer: Medicaid Other | Admitting: Dermatology

## 2020-08-18 ENCOUNTER — Ambulatory Visit (INDEPENDENT_AMBULATORY_CARE_PROVIDER_SITE_OTHER): Payer: Medicaid Other | Admitting: Dermatology

## 2020-08-18 ENCOUNTER — Other Ambulatory Visit: Payer: Self-pay

## 2020-08-18 ENCOUNTER — Encounter: Payer: Self-pay | Admitting: Dermatology

## 2020-08-18 DIAGNOSIS — B078 Other viral warts: Secondary | ICD-10-CM | POA: Diagnosis not present

## 2020-08-18 NOTE — Progress Notes (Signed)
° °  Follow-Up Visit   Subjective  Carolyn Lynch is a 35 y.o. female who presents for the following: Warts (Patient here today for wart follow up, previously treated with cantharidin. Patient advises there is a wart at bottom of each foot. ).  The following portions of the chart were reviewed this encounter and updated as appropriate:   Tobacco   Allergies   Meds   Problems   Med Hx   Surg Hx   Fam Hx       Review of Systems:  No other skin or systemic complaints except as noted in HPI or Assessment and Plan.  Objective  Well appearing patient in no apparent distress; mood and affect are within normal limits.  A focused examination was performed including feet. Relevant physical exam findings are noted in the Assessment and Plan.  Objective  Right plantar foot x 2, left plantar foot x 1 (3): Verrucous papules -- Discussed viral etiology and contagion.    Assessment & Plan  Other viral warts (3) Right plantar foot x 2, left plantar foot x 1  Squaric Acid 3% applied prior to cantharidin plus  Cantharidin is a blistering agent that comes from a beetle.  It needs to be washed off in about 4 hours after application.  Although it is painless when applied in office, it may cause symptoms of mild pain and burning several hours later.  Treated areas will swell and turn red, and blisters may form.  Vaseline and a bandaid may be applied until wound has healed.  Once healed, the skin may remain temporarily discolored.  It can take weeks to months for pigmentation to return to normal.  Multiple treatments can be required for clearance.  Reviewed risk of irritation and inflammation with squaric acid  Destruction of lesion - Right plantar foot x 2, left plantar foot x 1  Destruction method: chemical removal   Informed consent: discussed and consent obtained   Timeout:  patient name, date of birth, surgical site, and procedure verified Chemical destruction method: cantharidin   Chemical destruction  method comment:  Cantharidin plus Application time:  4 hours Procedure instructions: patient instructed to wash and dry area   Outcome: patient tolerated procedure well with no complications   Post-procedure details: wound care instructions given   Additional details:  Patient to wash off in 4 hours or sooner if becomes bothersome or sore.   Return in about 3 weeks (around 09/08/2020) for Wart.  Anise Salvo, RMA, am acting as scribe for Darden Dates, MD .  Documentation: I have reviewed the above documentation for accuracy and completeness, and I agree with the above.  Darden Dates, MD

## 2020-08-18 NOTE — Patient Instructions (Addendum)
Instructions for After In-Office Application of Cantharidin  1. This is a strong medicine; please follow ALL instructions.  2. Gently wash off with soap and water in four hours or sooner s directed by your physician.  3. **WARNING** this medicine can cause severe blistering, blood blisters, infection, and/or scarring if it is not washed off as directed.  4. Your progress will be rechecked in 1-2 months; call sooner if there are any questions or problems.  Cantharidin is a blistering agent that comes from a beetle.  It needs to be washed off in about 4 hours after application.  Although it is painless when applied in office, it may cause symptoms of mild pain and burning several hours later.  Treated areas will swell and turn red, and blisters may form.  Vaseline and a bandaid may be applied until wound has healed.  Once healed, the skin may remain temporarily discolored.  It can take weeks to months for pigmentation to return to normal.     

## 2020-09-07 ENCOUNTER — Other Ambulatory Visit: Payer: Self-pay

## 2020-09-07 ENCOUNTER — Ambulatory Visit (INDEPENDENT_AMBULATORY_CARE_PROVIDER_SITE_OTHER): Payer: Medicaid Other | Admitting: Dermatology

## 2020-09-07 DIAGNOSIS — B078 Other viral warts: Secondary | ICD-10-CM | POA: Diagnosis not present

## 2020-09-07 NOTE — Patient Instructions (Signed)
Instructions for After In-Office Application of Cantharidin  1. This is a strong medicine; please follow ALL instructions.  2. Gently wash off with soap and water in four hours or sooner s directed by your physician.  3. **WARNING** this medicine can cause severe blistering, blood blisters, infection, and/or scarring if it is not washed off as directed.  4. Your progress will be rechecked in 1-2 months; call sooner if there are any questions or problems.  Cantharidin is a blistering agent that comes from a beetle.  It needs to be washed off in about 4 hours after application.  Although it is painless when applied in office, it may cause symptoms of mild pain and burning several hours later.  Treated areas will swell and turn red, and blisters may form.  Vaseline and a bandaid may be applied until wound has healed.  Once healed, the skin may remain temporarily discolored.  It can take weeks to months for pigmentation to return to normal.     

## 2020-09-07 NOTE — Progress Notes (Signed)
   Follow-Up Visit   Subjective  Carolyn Lynch is a 35 y.o. female who presents for the following: Warts (Patient here today for 3 week follow up for warts at right plantar foot and left plantar foot. Previously treated with squaric acid 3% and cantharidin plus.).   The following portions of the chart were reviewed this encounter and updated as appropriate:   Tobacco  Allergies  Meds  Problems  Med Hx  Surg Hx  Fam Hx      Review of Systems:  No other skin or systemic complaints except as noted in HPI or Assessment and Plan.  Objective  Well appearing patient in no apparent distress; mood and affect are within normal limits.  A focused examination was performed including bilateral feet. Relevant physical exam findings are noted in the Assessment and Plan.  Objective  Left Plantar Foot, right plantar foot: Verrucous papules -- Discussed viral etiology and contagion.    Assessment & Plan  Other viral warts Left Plantar Foot, right plantar foot  Squaric Acid 3% and cantharidin plus applied today  Cantharidin is a blistering agent that comes from a beetle.  It needs to be washed off in about 4 hours after application.  Although it is painless when applied in office, it may cause symptoms of mild pain and burning several hours later.  Treated areas will swell and turn red, and blisters may form.  Vaseline and a bandaid may be applied until wound has healed.  Once healed, the skin may remain temporarily discolored.  It can take weeks to months for pigmentation to return to normal.    Squaric Acid 3% applied to warts today. Prior to application reviewed risk of inflammation and irritation.   Destruction of lesion - Left Plantar Foot, right plantar foot  Destruction method: chemical removal   Destruction method comment:  Squaric acid 3% and cantharidin plus Informed consent: discussed and consent obtained   Timeout:  patient name, date of birth, surgical site, and procedure  verified Chemical destruction method: cantharidin   Application time:  4 hours Procedure instructions: patient instructed to wash and dry area   Outcome: patient tolerated procedure well with no complications   Post-procedure details: wound care instructions given   Additional details:  Wash off in 4 hours or sooner if becomes tender or painful.  Return in about 3 weeks (around 09/28/2020) for Wart.  Anise Salvo, RMA, am acting as scribe for Darden Dates, MD .   Documentation: I have reviewed the above documentation for accuracy and completeness, and I agree with the above.  Darden Dates, MD

## 2020-09-19 ENCOUNTER — Encounter: Payer: Self-pay | Admitting: Dermatology

## 2020-09-28 ENCOUNTER — Other Ambulatory Visit: Payer: Self-pay

## 2020-09-28 ENCOUNTER — Ambulatory Visit (INDEPENDENT_AMBULATORY_CARE_PROVIDER_SITE_OTHER): Payer: Medicaid Other | Admitting: Dermatology

## 2020-09-28 DIAGNOSIS — B079 Viral wart, unspecified: Secondary | ICD-10-CM | POA: Diagnosis not present

## 2020-09-28 NOTE — Progress Notes (Signed)
   Follow-Up Visit   Subjective  Carolyn Lynch is a 35 y.o. female who presents for the following: 6 week follow up (Patient here today for follow up on warts on bottom of feet. ).  The following portions of the chart were reviewed this encounter and updated as appropriate:  Tobacco  Allergies  Meds  Problems  Med Hx  Surg Hx  Fam Hx      Objective  Well appearing patient in no apparent distress; mood and affect are within normal limits.  A focused examination was performed including right and left foot . Relevant physical exam findings are noted in the Assessment and Plan.  Objective  left plantar foot x 1, right plantar foot x 1: Left foot x 1 right foot x 1 Verrucous papules -- Discussed viral etiology and contagion.   Assessment & Plan  Viral warts, unspecified type left plantar foot x 1, right plantar foot x 1  Squaric Acid 3 % applied to warts today. Prior to application reviewed risk of inflammation and irritation  Cantharidin is a blistering agent that comes from a beetle.  It needs to be washed off in about 4 hours after application.  Although it is painless when applied in office, it may cause symptoms of mild pain and burning several hours later.  Treated areas will swell and turn red, and blisters may form.  Vaseline and a bandaid may be applied until wound has healed.  Once healed, the skin may remain temporarily discolored.  It can take weeks to months for pigmentation to return to normal.  The molluscum may resolve with this topical treatment, but often, additional treatments may be required to clear molluscum.  It is recommended to keep the skin well-moisturized and avoid scratching affected area to help prevent spread of the molluscum.   Destruction of lesion - left plantar foot x 1, right plantar foot x 1  Destruction method: chemical removal   Informed consent: discussed and consent obtained   Timeout:  patient name, date of birth, surgical site, and procedure  verified Chemical destruction method: cantharidin   Chemical destruction method comment:  Plus Application time:  4 hours Procedure instructions: patient instructed to wash and dry area   Outcome: patient tolerated procedure well with no complications   Post-procedure details: wound care instructions given   Additional details:  Advised to wash off with soap and water in 4 hours or sooner if tender before then  Return in about 3 weeks (around 10/19/2020) for wart follow up.  I, Asher Muir, CMA, am acting as scribe for Darden Dates, MD.   Documentation: I have reviewed the above documentation for accuracy and completeness, and I agree with the above.  Darden Dates, MD

## 2020-09-28 NOTE — Patient Instructions (Addendum)
Cantharidin is a blistering agent that comes from a beetle.  It needs to be washed off in about 4 hours after application.  Although it is painless when applied in office, it may cause symptoms of mild pain and burning several hours later.  Treated areas will swell and turn red, and blisters may form.  Vaseline and a bandaid may be applied until wound has healed.  Once healed, the skin may remain temporarily discolored.  It can take weeks to months for pigmentation to return to normal.  The molluscum may resolve with this topical treatment, but often, additional treatments may be required to clear molluscum.  It is recommended to keep the skin well-moisturized and avoid scratching affected area to help prevent spread of the molluscum. If you have any questions or concerns for your doctor, please call our main line at [phone] and press option 4 to reach your doctor's medical assistant. If no one answers, please leave a voicemail as directed and we will return your call as soon as possible. Messages left after 4 pm will be answered the following business day.   You may also send [place] a message via MyChart. We typically respond to MyChart messages within 1-2 business days.  For prescription refills, please ask your pharmacy to contact our office. Our fax number is [phone].  If you have an urgent issue when the clinic is closed that cannot wait until the next business day, you can page your doctor at the number below.    Please note that while we do our best to be available for urgent issues outside of office hours, we are not available 24/7.   If you have an urgent issue and are unable to reach [place], you may choose to seek medical care at your doctor's office, retail clinic, urgent care center, or emergency room.  If you have a medical emergency, please immediately call 911 or go to the emergency department.  Pager Numbers  - Dr. Gwen Pounds: [phone]  - Dr. Neale Burly: [phone]  - Dr.  Roseanne Reno: [phone]  In the event of inclement weather, please call our main line at [phone] for an update on the status of any delays or closures.  Dermatology Medication Tips: Please keep the boxes that topical medications come in in order to help keep track of the instructions about where and how to use these. Pharmacies typically print the medication instructions only on the boxes and not directly on the medication tubes.   If your medication is too expensive, please contact our office at [phone] option 4 or send [place] a message through MyChart.   We are unable to tell what your co-pay for medications will be in advance as this is different depending on your insurance coverage. However, we may be able to find a substitute medication at lower cost or fill out paperwork to get insurance to cover a needed medication.   If a prior authorization is required to get your medication covered by your insurance company, please allow [place] 1-2 business days to complete this process.  Drug prices often vary depending on where the prescription is filled and some pharmacies may offer cheaper prices.  The website www.goodrx.com contains coupons for medications through different pharmacies. The prices here do not account for what the cost may be with help from insurance (it may be cheaper with your insurance), but the website can give you the price if you did not use any insurance.  - You can print the associated coupon and take  it with your prescription to the pharmacy.  - You may also stop by our office during regular business hours and pick up a GoodRx coupon card.  - If you need your prescription sent electronically to a different pharmacy, notify our office through [facility] or by phone at [phone] option 4.

## 2020-10-03 ENCOUNTER — Encounter: Payer: Self-pay | Admitting: Dermatology

## 2020-10-20 ENCOUNTER — Ambulatory Visit: Payer: Medicaid Other | Admitting: Dermatology

## 2020-10-20 ENCOUNTER — Other Ambulatory Visit: Payer: Self-pay

## 2020-10-20 DIAGNOSIS — B079 Viral wart, unspecified: Secondary | ICD-10-CM | POA: Diagnosis not present

## 2020-10-20 NOTE — Patient Instructions (Signed)
Cryotherapy Aftercare  . Wash gently with soap and water everyday.   Marland Kitchen Apply Vaseline and Band-Aid daily until healed.  Cantharidin Plus is a blistering agent that comes from a beetle.  It needs to be washed off in about 4 hours after application.  Although it is painless when applied in office, it may cause symptoms of mild pain and burning several hours later.  Treated areas will swell and turn red, and blisters may form.  Vaseline and a bandaid may be applied until wound has healed.  Once healed, the skin may remain temporarily discolored.  It can take weeks to months for pigmentation to return to normal.  Advised to wash off with soap and water in 4 hours or sooner if it becomes tender before then.  If you have any questions or concerns for your doctor, please call our main line at [phone] and press option 4 to reach your doctor's medical assistant. If no one answers, please leave a voicemail as directed and we will return your call as soon as possible. Messages left after 4 pm will be answered the following business day.   You may also send [place] a message via MyChart. We typically respond to MyChart messages within 1-2 business days.  For prescription refills, please ask your pharmacy to contact our office. Our fax number is [phone].  If you have an urgent issue when the clinic is closed that cannot wait until the next business day, you can page your doctor at the number below.    Please note that while we do our best to be available for urgent issues outside of office hours, we are not available 24/7.   If you have an urgent issue and are unable to reach [place], you may choose to seek medical care at your doctor's office, retail clinic, urgent care center, or emergency room.  If you have a medical emergency, please immediately call 911 or go to the emergency department.  Pager Numbers  - Dr. Gwen Pounds: [phone]  - Dr. Neale Burly: [phone]  - Dr. Roseanne Reno: [phone]  In the  event of inclement weather, please call our main line at [phone] for an update on the status of any delays or closures.  Dermatology Medication Tips: Please keep the boxes that topical medications come in in order to help keep track of the instructions about where and how to use these. Pharmacies typically print the medication instructions only on the boxes and not directly on the medication tubes.   If your medication is too expensive, please contact our office at [phone] option 4 or send [place] a message through MyChart.   We are unable to tell what your co-pay for medications will be in advance as this is different depending on your insurance coverage. However, we may be able to find a substitute medication at lower cost or fill out paperwork to get insurance to cover a needed medication.   If a prior authorization is required to get your medication covered by your insurance company, please allow [place] 1-2 business days to complete this process.  Drug prices often vary depending on where the prescription is filled and some pharmacies may offer cheaper prices.  The website www.goodrx.com contains coupons for medications through different pharmacies. The prices here do not account for what the cost may be with help from insurance (it may be cheaper with your insurance), but the website can give you the price if you did not use any insurance.  - You can print the  associated coupon and take it with your prescription to the pharmacy.  - You may also stop by our office during regular business hours and pick up a GoodRx coupon card.  - If you need your prescription sent electronically to a different pharmacy, notify our office through [facility] or by phone at [phone] option 4.

## 2020-10-20 NOTE — Progress Notes (Signed)
   Follow-Up Visit   Subjective  Carolyn Lynch is a 35 y.o. female who presents for the following: Warts (Patient here today for wart follow up. She reports areas on right and left have gotten better but not completely gone. She was treated at last appointment for warts on left and right plantar feet. ).   The following portions of the chart were reviewed this encounter and updated as appropriate:  Tobacco  Allergies  Meds  Problems  Med Hx  Surg Hx  Fam Hx      Objective  Well appearing patient in no apparent distress; mood and affect are within normal limits.  A focused examination was performed including left and right plantar feet . Relevant physical exam findings are noted in the Assessment and Plan.  Objective  left plantar wart x 1, right plantar foot x 1 (2): Verrucous papules -- Discussed viral etiology and contagion.   Right plantar foot x 1 and left plantar foot x 1  Assessment & Plan  Viral warts, unspecified type (2) left plantar wart x 1, right plantar foot x 1  Prior to procedure, discussed risks of blister formation, small wound, skin dyspigmentation, or rare scar following cryotherapy.   Squaric Acid 3% applied to warts today. Prior to application reviewed risk of inflammation and irritation.  Cantharidin Plus is a blistering agent that comes from a beetle.  It needs to be washed off in about 4 hours after application.  Although it is painless when applied in office, it may cause symptoms of mild pain and burning several hours later.  Treated areas will swell and turn red, and blisters may form.  Vaseline and a bandaid may be applied until wound has healed.  Once healed, the skin may remain temporarily discolored.  It can take weeks to months for pigmentation to return to normal.  Advised to wash off with soap and water in 4 hours or sooner if it becomes tender before then.   Destruction of lesion - left plantar wart x 1, right plantar foot x 1  Destruction  method: chemical removal   Informed consent: discussed and consent obtained   Timeout:  patient name, date of birth, surgical site, and procedure verified Chemical destruction method: cantharidin   Chemical destruction method comment:  Plus Application time:  4 hours Procedure instructions: patient instructed to wash and dry area   Outcome: patient tolerated procedure well with no complications   Post-procedure details: wound care instructions given    Destruction of lesion - left plantar wart x 1  Destruction method: cryotherapy   Informed consent: discussed and consent obtained   Lesion destroyed using liquid nitrogen: Yes   Cryotherapy cycles:  2 Outcome: patient tolerated procedure well with no complications   Post-procedure details: wound care instructions given    Return in about 3 weeks (around 11/10/2020) for wart follow up.  I, Asher Muir, CMA, am acting as scribe for Darden Dates, MD.  Documentation: I have reviewed the above documentation for accuracy and completeness, and I agree with the above.  Darden Dates, MD

## 2020-10-21 ENCOUNTER — Encounter: Payer: Self-pay | Admitting: Dermatology

## 2020-11-10 ENCOUNTER — Other Ambulatory Visit: Payer: Self-pay

## 2020-11-10 ENCOUNTER — Ambulatory Visit (INDEPENDENT_AMBULATORY_CARE_PROVIDER_SITE_OTHER): Payer: Medicaid Other | Admitting: Dermatology

## 2020-11-10 ENCOUNTER — Encounter: Payer: Self-pay | Admitting: Dermatology

## 2020-11-10 DIAGNOSIS — B070 Plantar wart: Secondary | ICD-10-CM

## 2020-11-10 NOTE — Progress Notes (Signed)
   Follow-Up Visit   Subjective  Carolyn Lynch is a 35 y.o. female who presents for the following: Warts (B/L Plantar feet.  3 week recheck. ).  Children with patient.   The following portions of the chart were reviewed this encounter and updated as appropriate:  Tobacco  Allergies  Meds  Problems  Med Hx  Surg Hx  Fam Hx      Review of Systems: No other skin or systemic complaints except as noted in HPI or Assessment and Plan.   Objective  Well appearing patient in no apparent distress; mood and affect are within normal limits.  A focused examination was performed including face, feet. Relevant physical exam findings are noted in the Assessment and Plan.  Objective  Left Plantar foot: Verrucous papules -- Discussed viral etiology and contagion.   Assessment & Plan  Plantar wart Left Plantar foot  Cantharidin Plus is a blistering agent that comes from a beetle.  It needs to be washed off in about 4 hours after application.  Although it is painless when applied in office, it may cause symptoms of mild pain and burning several hours later.  Treated areas will swell and turn red, and blisters may form.  Vaseline and a bandaid may be applied until wound has healed.  Once healed, the skin may remain temporarily discolored.  It can take weeks to months for pigmentation to return to normal.  Advised to wash off with soap and water in 4 hours or sooner if it becomes tender before then.  Squaric Acid 3% applied to warts today. Prior to application reviewed risk of inflammation and irritation.  Discussed [place] candida injection. Deferred today.  Once healed, start WartPEEL (5-fluorouracil/salicylic acid) nightly under occlusion. Reviewed risk of irritation and redness. Avoid applying to normal surrounding skin.  Destruction of lesion - Left Plantar foot  Destruction method: cryotherapy   Informed consent: discussed and consent obtained   Lesion destroyed using liquid nitrogen: Yes    Outcome: patient tolerated procedure well with no complications   Post-procedure details: wound care instructions given   Additional details:  Cantharone plus and Squaric acid 3% applied today. Wash off in 3-4 hours  Return in about 3 weeks (around 12/01/2020) for wart recheck.   I, Lawson Radar, CMA, am acting as scribe for Darden Dates, MD.  Documentation: I have reviewed the above documentation for accuracy and completeness, and I agree with the above.  Darden Dates, MD

## 2020-11-10 NOTE — Patient Instructions (Addendum)
Cryotherapy  Cryotherapy is the treatment of lesions with the application of a cold substance.  In most cases, liquid nitrogen is used to destroy the lesion(s).  Liquid nitrogen is so cold, -196 Celsius, it feels like it is burning when it is applied.  After treatment with liquid nitrogen, there may be some burning sensation or pain that can last up to 24 hours.  The area may also be swollen and red.  The discomfort can be relieved with ibuprofen (Advil, Motrin), acetaminophen (Extra Strength Tylenol), or similar pain relief medication.  Within 24 to 48 hours, a blister may form.  Occasionally, these blisters will become filled with blood and become very dark.  This is no cause for concern.  The blister will gradually dry up over a period of several days, eventually separating from the healing skin below in about one to two weeks.  The surrounding skin will become red and the area may become itchy.  This is all part of the normal healing process.  Occasionally, the crusts will last as long as four weeks when certain deeper spots on the skin are treated.  Sometimes a permanent white mark or scar will be left after healing.  You may continue all of your normal activities as long as they do not cause pain in the treated areas.  It is okay to get the area wet.  After therapy or if the blister is still intact - You may treat it like normal skin.  Covering the area with a bandage may offer some comfort and protection from trauma but is not absolutely necessary.  If the blister is uncomfortable - Clean a small needle with rubbing alcohol, then gently make a small hole in the side of the blister to drain the blister fluid.  This often gives immediate relief.  Do not remove the blister roof, as the blister aids in healing.  In time it will fall off on its own.   Once the blister roof falls off or a sore forms -  . Clean the blister sites with soap and water.  Rinse the area and pat dry.  Do not force off the  blister roof or crust. . Apply an antibiotic ointment such as Polysporin or Bacitracin. . A bandage may be applied loosely over the blister until it is healed if desired. . Call our office if you are concerned it may be infected.  Some redness, itching and oozing is part of the normal healing process.  Signs of infection include increasing redness, increasing pain, swelling, heat, or yellow discharge.  Instructions for After In-Office Application of Cantharidin  1. This is a strong medicine; please follow ALL instructions.  2. Gently wash off with soap and water in four hours or sooner s directed by your physician.  3. **WARNING** this medicine can cause severe blistering, blood blisters, infection, and/or scarring if it is not washed off as directed.  4. Your progress will be rechecked in 1-2 months; call sooner if there are any questions or problems.  Viral Warts & Molluscum Contagiosum  Viral warts and molluscum contagiosum are growths of the skin caused by viral infection of the skin. If you have been given the diagnosis of viral warts or molluscum contagiosum there are a few things that you must understand about your condition:  1. There is no guaranteed treatment method available for this condition. 2. Multiple treatments may be required, 3. The treatments may be time consuming and require multiple visits to the dermatology  office. 4. The treatment may be expensive. You will be charged each time you come into the office to have the spots treated. 5. The treated areas may develop new lesions further complicating treatment. 6. The treated areas may leave a scar. 7. There is no guarantee that even after multiple treatments that the spots will be successfully treated. 8. These are caused by a viral infection and can be spread to other areas of the skin and to other people by direct contact. Therefore, new spots may occur.      WARTPEEL INSTRUCTIONS  WartPEEL is a medicine to treat  warts that has been prescribed for you and can be filled only at a special compounding pharmacy. Please call the pharmacy below to fill your prescription. Once they confirm your address and payment, they will mail you the medicine. DO NOT USE THIS MEDICINE IF YOU ARE PREGNANT OR THINKING OF BECOMING PREGNANT.  [facility] Ph: [phone]   Must be dispensed in amber syringe to ensure quality of medication. PRIOR TO USING THIS MEDICATION, IT IS IMPORTANT TO INFORM YOUR PHYSICIAN IF YOU ARE PREGNANT OR THINKING OF BECOMING PREGNANT. **This medication was custom compounded for you based on the prescription orders of your physician.  How to use this medication: 1. Apply medication at bedtime. 2. Apply very small amount of medication to a flat plastic applicator. Use the applicator to apply a thin layer directly onto the wart. Use care to avoid applying the medicine to healthy skin. The medicine will break down healthy skin as well as the warts. 3. Cover the wart with the tape provided.  4. Put the cap back tightly on the syringe. 5. Wash hands after applying the medicine. NEVER put the WartPEEL in the mouth, nose or eyes. 6. Remove the occlusion in the morning and wash the area thoroughly.  7. In case of accidental ingestion or contact with the eye, nose or mouth, contact [facility] or the local poison control.   What to expect: During the first few days of application the skin around the wart may swell and become white. This will slough off with continued applications. Normal Dosage: The medication is applied once daily for a time determined by your physician. Storage Requirements: Store this medication at room temperature. Keep out of reach of children. PROTECT FROM LIGHT. Expiration Date: The medication is good for four months from the date made. Do not keep outdated medication. Side effects: Rash and irritation, if medication is applied to good skin. Cautions and  Warnings: Only apply the medication to the warts. Do not apply to good skin. Keep away from children. Do not use on nose, eyes or the mouth.

## 2020-11-13 ENCOUNTER — Encounter: Payer: Self-pay | Admitting: Dermatology

## 2020-12-01 ENCOUNTER — Ambulatory Visit (INDEPENDENT_AMBULATORY_CARE_PROVIDER_SITE_OTHER): Payer: Medicaid Other | Admitting: Dermatology

## 2020-12-01 ENCOUNTER — Other Ambulatory Visit: Payer: Self-pay

## 2020-12-01 DIAGNOSIS — B070 Plantar wart: Secondary | ICD-10-CM

## 2020-12-01 NOTE — Patient Instructions (Signed)
Viral Warts & Molluscum Contagiosum   Viral warts and molluscum contagiosum are growths of the skin caused by viral infection of the skin. If you have been given the diagnosis of viral warts or molluscum contagiosum there are a few things that you must understand about your condition:   There is no guaranteed treatment method available for this condition. Multiple treatments may be required, The treatments may be time consuming and require multiple visits to the dermatology office. The treatment may be expensive. You will be charged each time you come into the office to have the spots treated. The treated areas may develop new lesions further complicating treatment. The treated areas may leave a scar. There is no guarantee that even after multiple treatments that the spots will be successfully treated. These are caused by a viral infection and can be spread to other areas of the skin and to other people by direct contact. Therefore, new spots may occur.   Instructions for After In-Office Application of Cantharidin   1. This is a strong medicine; please follow ALL instructions.   2. Gently wash off with soap and water in four hours or sooner s directed by your physician.   3. **WARNING** this medicine can cause severe blistering, blood blisters, infection, and/or scarring if it is not washed off as directed.   4. Your progress will be rechecked in 1-2 months; call sooner if there are any questions or problems.  If you have any questions or concerns for your doctor, please call our main line at [phone] and press option 4 to reach your doctor's medical assistant. If no one answers, please leave a voicemail as directed and we will return your call as soon as possible. Messages left after 4 pm will be answered the following business day.   You may also send [place] a message via MyChart. We typically respond to MyChart messages within 1-2 business days.  For prescription refills, please ask  your pharmacy to contact our office. Our fax number is [phone].  If you have an urgent issue when the clinic is closed that cannot wait until the next business day, you can page your doctor at the number below.    Please note that while we do our best to be available for urgent issues outside of office hours, we are not available 24/7.   If you have an urgent issue and are unable to reach [place], you may choose to seek medical care at your doctor's office, retail clinic, urgent care center, or emergency room.  If you have a medical emergency, please immediately call 911 or go to the emergency department.  Pager Numbers  - Dr. Gwen Pounds: [phone]  - Dr. Neale Burly: [phone]  - Dr. Roseanne Reno: [phone]  In the event of inclement weather, please call our main line at [phone] for an update on the status of any delays or closures.  Dermatology Medication Tips: Please keep the boxes that topical medications come in in order to help keep track of the instructions about where and how to use these. Pharmacies typically print the medication instructions only on the boxes and not directly on the medication tubes.   If your medication is too expensive, please contact our office at [phone] option 4 or send [place] a message through MyChart.   We are unable to tell what your co-pay for medications will be in advance as this is different depending on your insurance coverage. However, we may be able to find a substitute medication at lower  cost or fill out paperwork to get insurance to cover a needed medication.   If a prior authorization is required to get your medication covered by your insurance company, please allow [place] 1-2 business days to complete this process.  Drug prices often vary depending on where the prescription is filled and some pharmacies may offer cheaper prices.  The website www.goodrx.com contains coupons for medications through different pharmacies. The prices here do not  account for what the cost may be with help from insurance (it may be cheaper with your insurance), but the website can give you the price if you did not use any insurance.  - You can print the associated coupon and take it with your prescription to the pharmacy.  - You may also stop by our office during regular business hours and pick up a GoodRx coupon card.  - If you need your prescription sent electronically to a different pharmacy, notify our office through [facility] or by phone at [phone] option 4.

## 2020-12-01 NOTE — Progress Notes (Signed)
   Follow-Up Visit   Subjective  Carolyn Lynch is a 35 y.o. female who presents for the following: 3 week wart recheck (Patient here today for 3 week recheck on warts at bottom of both feet. Patient reports they are doing better. ).  The following portions of the chart were reviewed this encounter and updated as appropriate:  Tobacco  Allergies  Meds  Problems  Med Hx  Surg Hx  Fam Hx       Review of Systems: No other skin or systemic complaints.  Objective  Well appearing patient in no apparent distress; mood and affect are within normal limits.  A focused examination was performed including left foot. Relevant physical exam findings are noted in the Assessment and Plan.  Left Middle Plantar Surface Verrucous papules -- Discussed viral etiology and contagion. X 1 at left plantar foot   Assessment & Plan  Plantar wart Left Middle Plantar Surface  Cantharidin Plus is a blistering agent that comes from a beetle.  It needs to be washed off in about 4 hours after application.  Although it is painless when applied in office, it may cause symptoms of mild pain and burning several hours later.  Treated areas will swell and turn red, and blisters may form.  Vaseline and a bandaid may be applied until wound has healed.  Once healed, the skin may remain temporarily discolored.  It can take weeks to months for pigmentation to return to normal.  Advised to wash off with soap and water in 4 hours or sooner if it becomes tender before then.   Squaric Acid 3% applied to warts today. Prior to application reviewed risk of inflammation and irritation.  Destruction of lesion - Left Middle Plantar Surface  Destruction method: chemical removal   Informed consent: discussed and consent obtained   Timeout:  patient name, date of birth, surgical site, and procedure verified Chemical destruction method: cantharidin   Chemical destruction method comment:  Plus Application time:  4 hours Procedure  instructions: patient instructed to wash and dry area   Outcome: patient tolerated procedure well with no complications   Post-procedure details: wound care instructions given    Return for 3 - 4 week follow up. I, Asher Muir, CMA, am acting as scribe for Darden Dates, MD.  Documentation: I have reviewed the above documentation for accuracy and completeness, and I agree with the above.  Darden Dates, MD

## 2020-12-02 ENCOUNTER — Encounter: Payer: Self-pay | Admitting: Dermatology

## 2020-12-21 ENCOUNTER — Ambulatory Visit (INDEPENDENT_AMBULATORY_CARE_PROVIDER_SITE_OTHER): Payer: Medicaid Other | Admitting: Dermatology

## 2020-12-21 ENCOUNTER — Other Ambulatory Visit: Payer: Self-pay

## 2020-12-21 ENCOUNTER — Encounter: Payer: Self-pay | Admitting: Dermatology

## 2020-12-21 DIAGNOSIS — B079 Viral wart, unspecified: Secondary | ICD-10-CM

## 2020-12-21 NOTE — Patient Instructions (Signed)

## 2020-12-21 NOTE — Progress Notes (Signed)
   Follow-Up Visit   Subjective  Carolyn Lynch is a 35 y.o. female who presents for the following: plantar wart follow up (Patient here today for plantar wart follow up on bottom of left foot. ).  The following portions of the chart were reviewed this encounter and updated as appropriate:  Tobacco  Allergies  Meds  Problems  Med Hx  Surg Hx  Fam Hx       Objective  Well appearing patient in no apparent distress; mood and affect are within normal limits.  A focused examination was performed including left plantar foot. Relevant physical exam findings are noted in the Assessment and Plan.  Left Middle Plantar Surface Clear today   Assessment & Plan  Viral warts, unspecified type Left Middle Plantar Surface  Clear today; call for recurrence With peeling on exam secondary to previous treatment. This will resolve with time.  Return if symptoms worsen or fail to improve. I, Asher Muir, CMA, am acting as scribe for Darden Dates, MD.  Documentation: I have reviewed the above documentation for accuracy and completeness, and I agree with the above.  Darden Dates, MD

## 2021-03-14 ENCOUNTER — Encounter: Payer: Self-pay | Admitting: Obstetrics and Gynecology

## 2021-03-14 ENCOUNTER — Encounter: Payer: Medicaid Other | Admitting: Obstetrics and Gynecology

## 2021-03-23 ENCOUNTER — Other Ambulatory Visit: Payer: Self-pay | Admitting: Family Medicine

## 2021-03-23 DIAGNOSIS — N644 Mastodynia: Secondary | ICD-10-CM

## 2021-04-13 ENCOUNTER — Encounter: Payer: Medicaid Other | Admitting: Obstetrics and Gynecology

## 2021-04-14 ENCOUNTER — Other Ambulatory Visit: Payer: Self-pay | Admitting: Family Medicine

## 2021-04-14 DIAGNOSIS — N644 Mastodynia: Secondary | ICD-10-CM

## 2021-04-17 IMAGING — US US ABDOMEN COMPLETE
1 series · 14 of 25 positions shown · non-contrast
Comparison: None.

CLINICAL DATA: Right upper quadrant abdominal pain for 1 week.

EXAM:
ABDOMEN ULTRASOUND COMPLETE

[Series 1: us abdomen complete · 0.23mm/px · 14 of 111 slices shown]
[im 1/111]
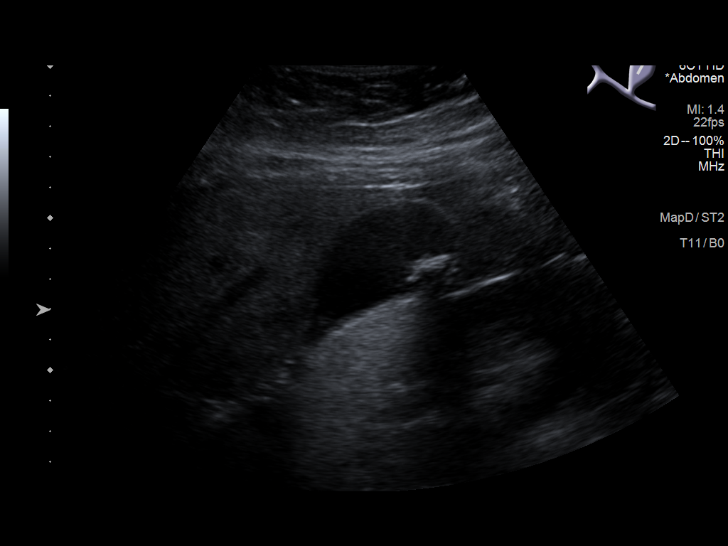
[im 10/111]
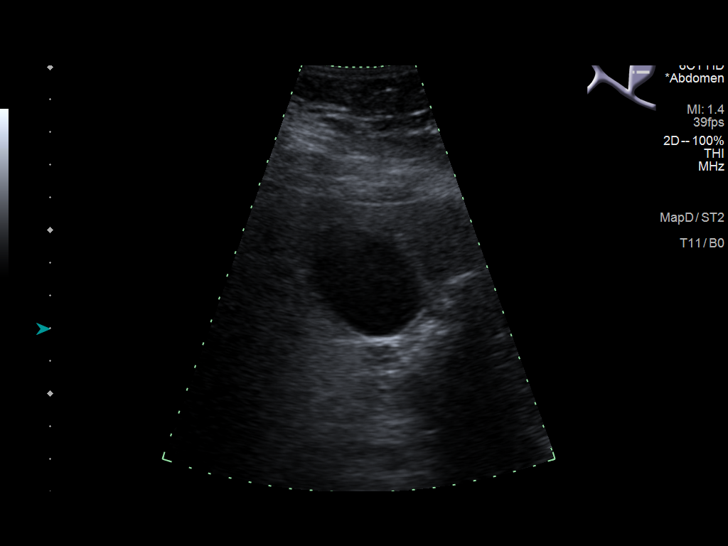
[im 19/111]
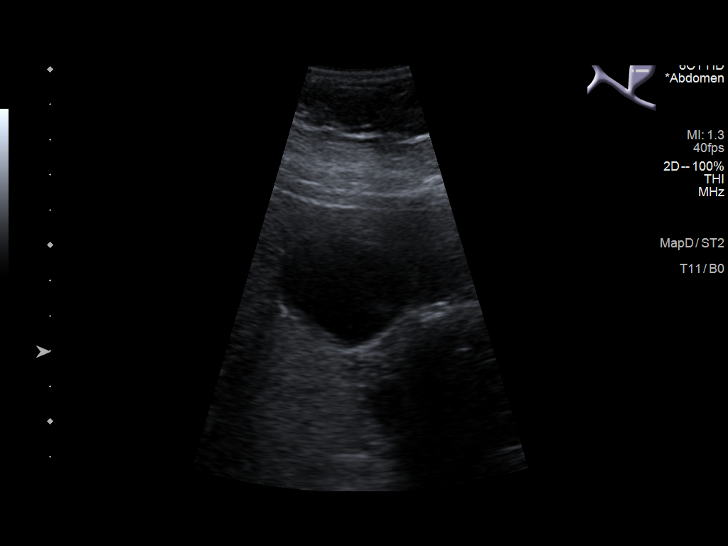
[im 28/111]
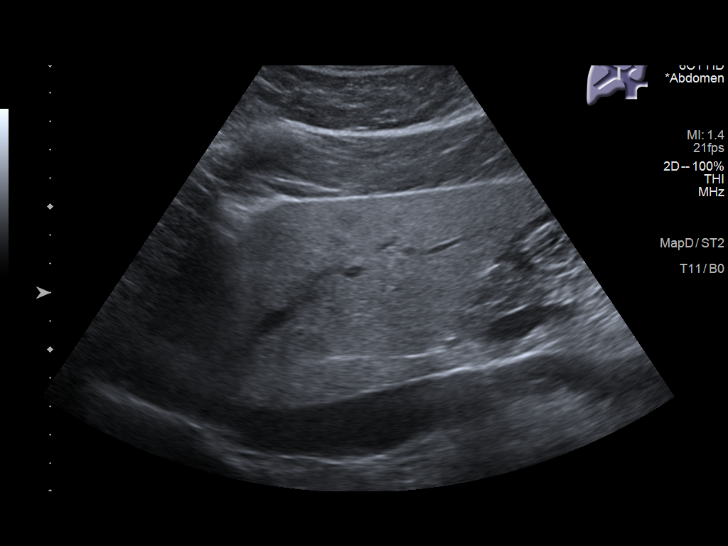
[im 37/111]
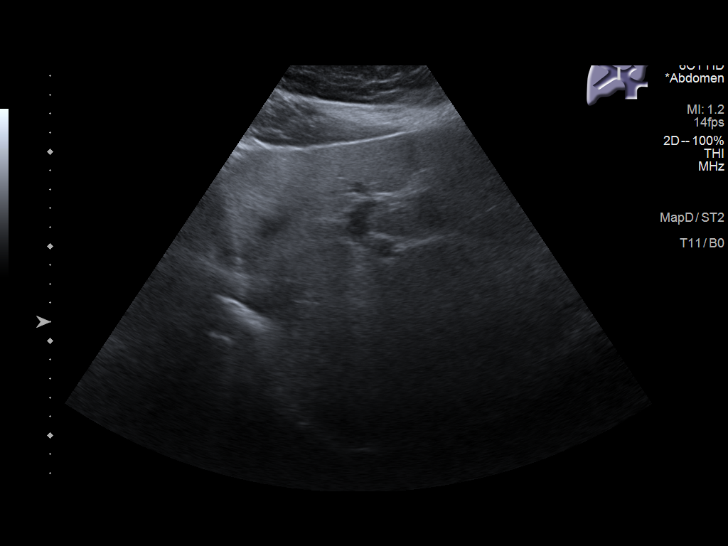
[im 42/111]
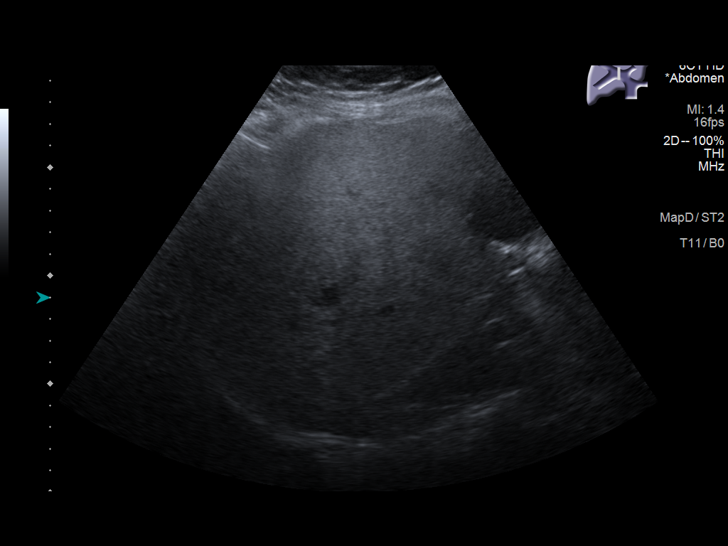
[im 51/111]
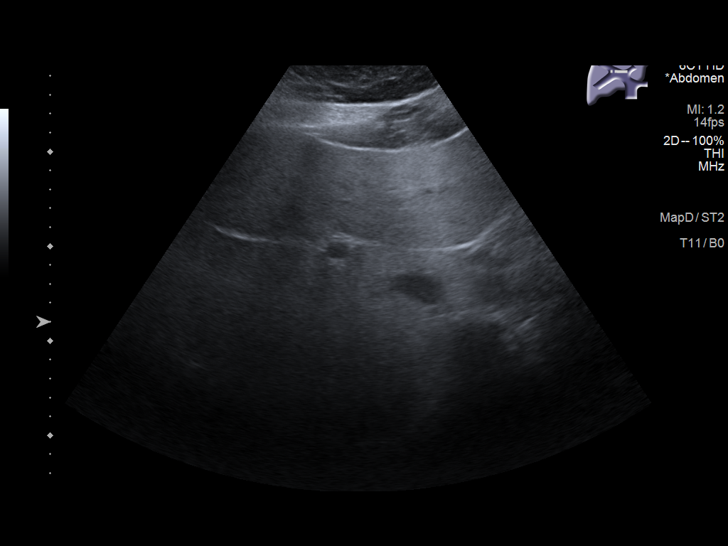
[im 60/111]
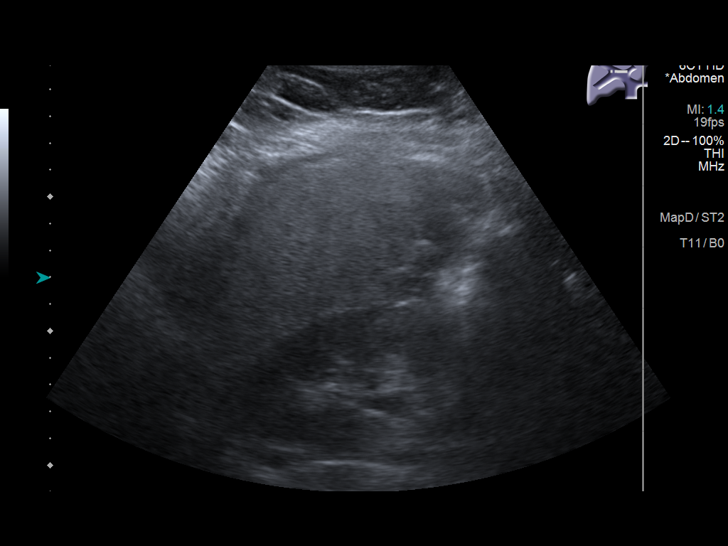
[im 69/111]
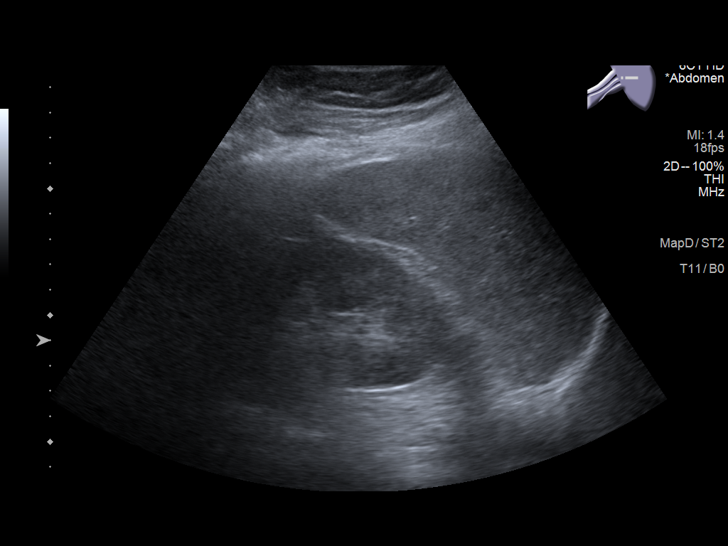
[im 74/111]
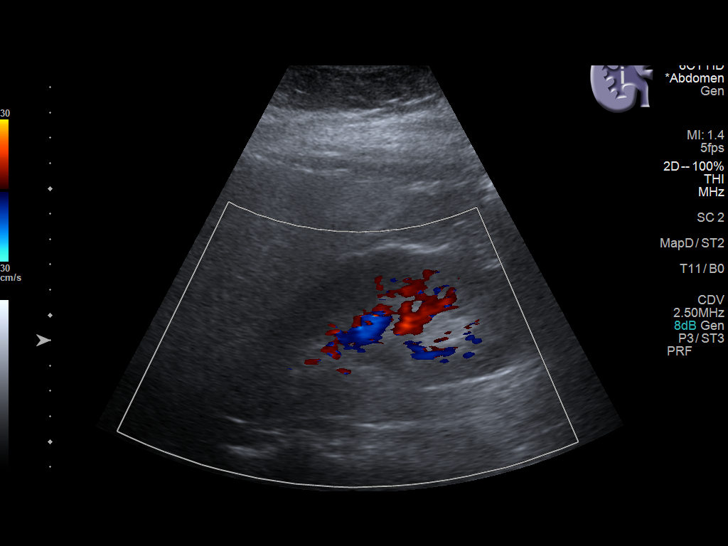
[im 83/111]
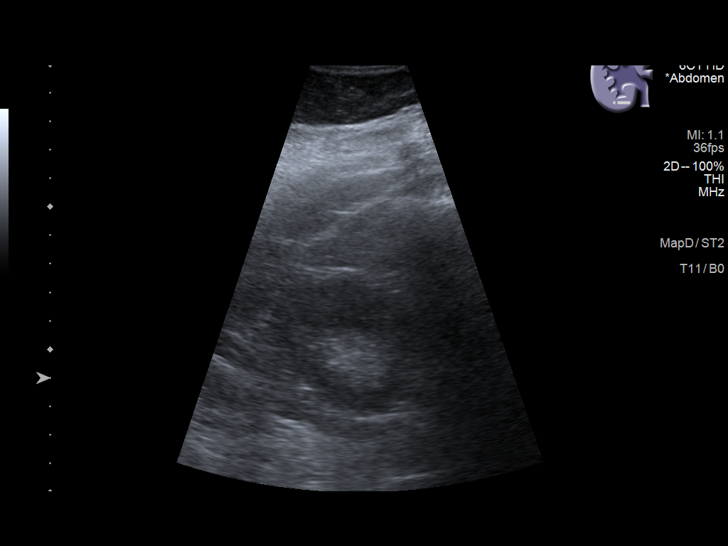
[im 92/111]
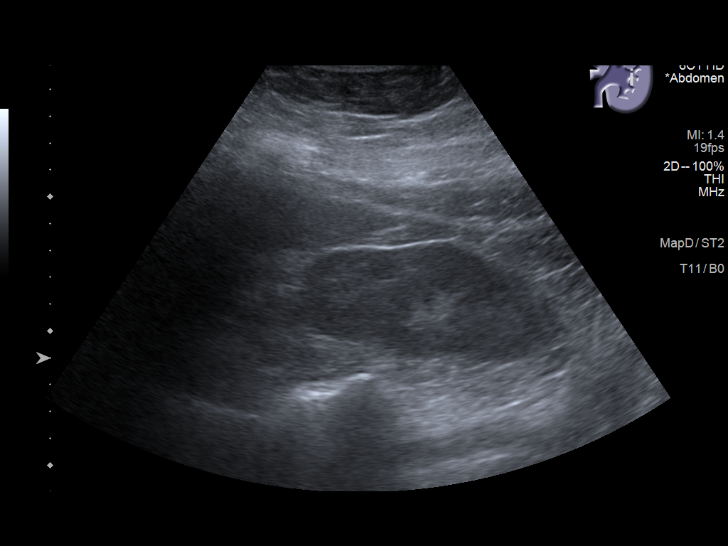
[im 101/111]
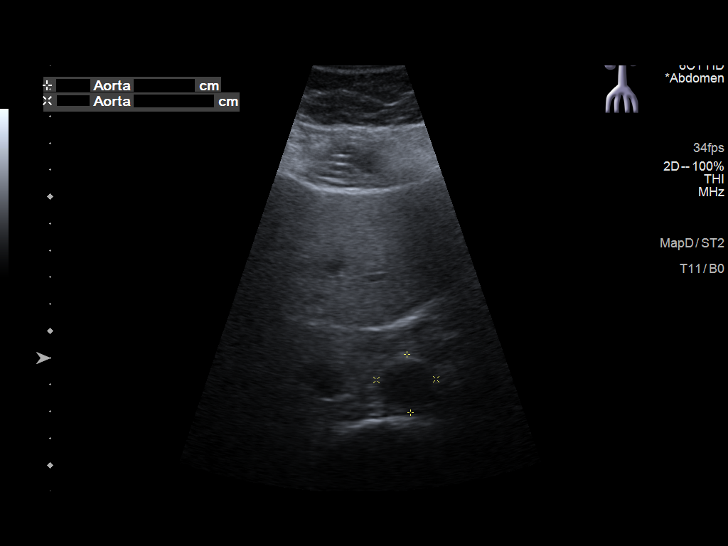
[im 111/111]
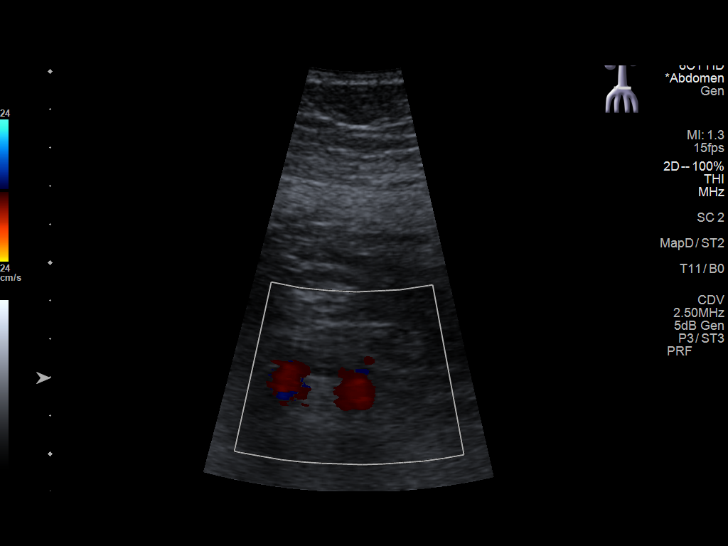

[14 of 25 positions shown; findings below may reference images not displayed]

FINDINGS: Gallbladder: Echogenic stones in the dependent gallbladder. No wall
thickening. No pericholecystic fluid.

Common bile duct: Diameter: 3 mm

Liver: Echogenic. Top-normal in size. Decreased through transmission
of the sound beam. No mass or focal lesion. Portal vein is patent on
color Doppler imaging with normal direction of blood flow towards
the liver.

IVC: No abnormality visualized.

Pancreas: Visualized portion unremarkable.

Spleen: Normal in size and echogenicity.  No mass or focal lesion.

Right Kidney: Length: 13.2 cm. Echogenicity within normal limits. No
mass or hydronephrosis visualized.

Left Kidney: Length: 13.2 cm. Echogenicity within normal limits. No
mass or hydronephrosis visualized.

Abdominal aorta: No aneurysm visualized.

Other findings: None.
IMPRESSION: 1. No acute findings.
2. Gallstones without evidence of acute cholecystitis. No bile duct
dilation.
3. Liver appearance consistent with diffuse hepatic steatosis.

## 2021-04-25 ENCOUNTER — Other Ambulatory Visit: Payer: Self-pay

## 2021-04-25 ENCOUNTER — Ambulatory Visit (INDEPENDENT_AMBULATORY_CARE_PROVIDER_SITE_OTHER): Payer: Medicaid Other | Admitting: Obstetrics and Gynecology

## 2021-04-25 ENCOUNTER — Encounter: Payer: Self-pay | Admitting: Obstetrics and Gynecology

## 2021-04-25 ENCOUNTER — Other Ambulatory Visit (HOSPITAL_COMMUNITY)
Admission: RE | Admit: 2021-04-25 | Discharge: 2021-04-25 | Disposition: A | Discharge status: Home / Self Care | Payer: Medicaid Other | Source: Ambulatory Visit | Attending: Obstetrics and Gynecology | Admitting: Obstetrics and Gynecology

## 2021-04-25 VITALS — BP 119/84 | HR 91 | Resp 16 | Ht 63.5 in | Wt 203.5 lb

## 2021-04-25 DIAGNOSIS — Z113 Encounter for screening for infections with a predominantly sexual mode of transmission: Secondary | ICD-10-CM | POA: Insufficient documentation

## 2021-04-25 DIAGNOSIS — Z01419 Encounter for gynecological examination (general) (routine) without abnormal findings: Secondary | ICD-10-CM | POA: Diagnosis present

## 2021-04-25 DIAGNOSIS — Z124 Encounter for screening for malignant neoplasm of cervix: Secondary | ICD-10-CM | POA: Diagnosis present

## 2021-04-25 NOTE — Progress Notes (Signed)
HPI:      Carolyn Lynch is a 35 y.o. G1P1001 who LMP was Patient's last menstrual period was 04/05/2021 (approximate).  Subjective:   She presents today for her annual examination.  She has not been seen in over 3 years.  Her last Pap smear was negative cytology and negative HPV more than 3 years ago. She is having normal regular menstrual cycles.  She is not sexually active.  She is not using anything for birth control.  She does state that she occasionally has some pain in her right breast and her PCP has ordered her mammogram.  She is not sure if this is related to her cycle. She also complains of a small cyst in her right mons/labial area.  She reports that it is nontender.  She thinks its been there approximately 3 years.    Hx: The following portions of the patient's history were reviewed and updated as appropriate:             She  has a past medical history of Anxiety, GERD (gastroesophageal reflux disease), Seizure ([place]), and Vitamin D deficiency. She does not have any pertinent problems on file. She  has a past surgical history that includes Wisdom tooth extraction. Her family history includes Cancer in her paternal grandfather and paternal grandmother; Diabetes in her mother; Hyperlipidemia in her mother; Hypertension in her father; Stroke in her mother. She  reports that she has never smoked. She has never used smokeless tobacco. She reports that she does not drink alcohol and does not use drugs. She has a current medication list which includes the following prescription(s): nystatin and ozempic (0.25 or 0.5 mg/dose). She has No Known Allergies.       Review of Systems:  Review of Systems  Constitutional: Denied constitutional symptoms, night sweats, recent illness, fatigue, fever, insomnia and weight loss.  Eyes: Denied eye symptoms, eye pain, photophobia, vision change and visual disturbance.  Ears/Nose/Throat/Neck: Denied ear, nose, throat or neck symptoms, hearing loss, nasal  discharge, sinus congestion and sore throat.  Cardiovascular: Denied cardiovascular symptoms, arrhythmia, chest pain/pressure, edema, exercise intolerance, orthopnea and palpitations.  Respiratory: Denied pulmonary symptoms, asthma, pleuritic pain, productive sputum, cough, dyspnea and wheezing.  Gastrointestinal: Denied, gastro-esophageal reflux, melena, nausea and vomiting.  Genitourinary: See HPI for additional information.  Musculoskeletal: Denied musculoskeletal symptoms, stiffness, swelling, muscle weakness and myalgia.  Dermatologic: Denied dermatology symptoms, rash and scar.  Neurologic: Denied neurology symptoms, dizziness, headache, neck pain and syncope.  Psychiatric: Denied psychiatric symptoms, anxiety and depression.  Endocrine: Denied endocrine symptoms including hot flashes and night sweats.   Meds:   Current Outpatient Medications on File Prior to Visit  Medication Sig Dispense Refill   nystatin (MYCOSTATIN/NYSTOP) powder Apply topically 4 (four) times daily.     OZEMPIC, 0.25 OR 0.5 MG/DOSE, 2 MG/1.5ML SOPN Inject into the skin.     No current facility-administered medications on file prior to visit.      Objective:     Vitals:   04/25/21 1105  BP: 119/84  Pulse: 91  Resp: 16    Filed Weights   04/25/21 1105  Weight: 203 lb 8 oz (92.3 kg)              Physical examination General NAD, Conversant  HEENT Atraumatic; Op clear with mmm.  Normo-cephalic. Pupils reactive. Anicteric sclerae  Thyroid/Neck Smooth without nodularity or enlargement. Normal ROM.  Neck Supple.  Skin No rashes, lesions or ulceration. Normal palpated skin turgor. No nodularity.  Breasts: No masses or discharge.  Symmetric.  No axillary adenopathy.  Lungs: Clear to auscultation.No rales or wheezes. Normal Respiratory effort, no retractions.  Heart: NSR.  No murmurs or rubs appreciated. No periferal edema  Abdomen: Soft.  Non-tender.  No masses.  No HSM. No hernia  Extremities: Moves  all appropriately.  Normal ROM for age. No lymphadenopathy.  Neuro: Oriented to PPT.  Normal mood. Normal affect.     Pelvic:   Vulva: Normal appearance.  No lesions.  Small tubular cystic structure deep within the mons/upper labia.  Nontender.  Not erythematous.  Vagina: No lesions or abnormalities noted.  Support: Normal pelvic support.  Urethra No masses tenderness or scarring.  Meatus Normal size without lesions or prolapse.  Cervix: Normal appearance.  No lesions.  Anus: Normal exam.  No lesions.  Perineum: Normal exam.  No lesions.        Bimanual   Uterus: Normal size.  Non-tender.  Mobile.  AV.  Adnexae: No masses.  Non-tender to palpation.  Cul-de-sac: Negative for abnormality.     Assessment:    G1P1001 Patient Active Problem List   Diagnosis Date Noted   Post-dates pregnancy 07/12/2017   Term pregnancy 07/12/2017   Encounter for supervision of normal first pregnancy in third trimester 03/27/2017   Hemorrhoids in pregnancy in second trimester 03/27/2017     1. Well woman exam with routine gynecological exam   2. Cervical cancer screening     Small cystic structure in mons-likely remnant cyst.  Patient is asymptomatic   Plan:            1.  Basic Screening Recommendations The basic screening recommendations for asymptomatic women were discussed with the patient during her visit.  The age-appropriate recommendations were discussed with her and the rational for the tests reviewed.  When I am informed by the patient that another primary care physician has previously obtained the age-appropriate tests and they are up-to-date, only outstanding tests are ordered and referrals given as necessary.  Abnormal results of tests will be discussed with her when all of her results are completed.  Routine preventative health maintenance measures emphasized: Exercise/Diet/Weight control, Tobacco Warnings, Alcohol/Substance use risks and Stress Management Pap performed 2.  Reassured  patient regarding possibility of remnant cyst.  Expectant management as she is asymptomatic. 3.  I have asked her to track the pain in the right breast and see if it is cycle related.  Restriction of caffeine red wine etc. discussed in light of possible fibrocystic breast changes. (No fibrocystic changes noted on exam)  Orders No orders of the defined types were placed in this encounter.   No orders of the defined types were placed in this encounter.         F/U  No follow-ups on file.  [name], M.D. 04/25/2021 11:23 AM

## 2021-04-27 LAB — CYTOLOGY - PAP
Comment: NEGATIVE
Diagnosis: NEGATIVE
High risk HPV: NEGATIVE

## 2021-05-05 ENCOUNTER — Ambulatory Visit
Admission: RE | Admit: 2021-05-05 | Discharge: 2021-05-05 | Disposition: A | Discharge status: Home / Self Care | Payer: Medicaid Other | Source: Ambulatory Visit | Attending: Family Medicine | Admitting: Family Medicine

## 2021-05-05 ENCOUNTER — Other Ambulatory Visit: Payer: Self-pay

## 2021-05-05 DIAGNOSIS — N644 Mastodynia: Secondary | ICD-10-CM

## 2021-06-02 ENCOUNTER — Encounter: Payer: Self-pay | Admitting: Emergency Medicine

## 2021-06-02 ENCOUNTER — Other Ambulatory Visit: Payer: Self-pay

## 2021-06-02 ENCOUNTER — Ambulatory Visit
Admission: EM | Admit: 2021-06-02 | Discharge: 2021-06-02 | Disposition: A | Discharge status: Home / Self Care | Payer: Medicaid Other | Attending: Family Medicine | Admitting: Family Medicine

## 2021-06-02 DIAGNOSIS — M792 Neuralgia and neuritis, unspecified: Secondary | ICD-10-CM | POA: Diagnosis not present

## 2021-06-02 MED ORDER — PREDNISONE 20 MG PO TABS: 40.0000 mg | ORAL_TABLET | Freq: Every day | ORAL | 0 refills | Status: DC

## 2021-06-02 MED ORDER — CYCLOBENZAPRINE HCL 5 MG PO TABS: 5.0000 mg | ORAL_TABLET | Freq: Three times a day (TID) | ORAL | 0 refills | Status: DC | PRN

## 2021-06-02 NOTE — ED Triage Notes (Signed)
Patient c/o LFT sided chest pain that started "around midnight" today.   Patient denies SOB, Dizziness, Headache, or LOC.   Patient endorses chest pain awake her out of sleep.   Patient endorses pain in LFT arm and LFT side of upper back.   Patient endorses worsening pain with movement.   Patient endorses previous episode of similar symptoms " I went to the ED and everything came back normal".

## 2021-06-05 NOTE — ED Provider Notes (Signed)
[facility]    CSN: [id number] Arrival date & time: 06/02/21  1700      History   Chief Complaint Chief Complaint  Patient presents with   Chest Pain    HPI Carolyn Lynch is a 35 y.o. female.   Presenting today with left sided upper back, shoulder pain that sometimes shoots into left lateral chest with movement. Denies SOB, palpitations, dizziness, headache, LOC. Pain started overnight last night and is much worse with movement, especially lifting. States has been seen in the ED for similar sxs with reassuring cardiac evaluation. Not trying anything OTC for sxs.    Past Medical History:  Diagnosis Date   Anxiety    GERD (gastroesophageal reflux disease)    OCC   Seizure ([facility])    LAST HAD AS A CHILD   Vitamin D deficiency     Patient Active Problem List   Diagnosis Date Noted   Post-dates pregnancy 07/12/2017   Term pregnancy 07/12/2017   Encounter for supervision of normal first pregnancy in third trimester 03/27/2017   Hemorrhoids in pregnancy in second trimester 03/27/2017    Past Surgical History:  Procedure Laterality Date   WISDOM TOOTH EXTRACTION      OB History     Gravida  1   Para  1   Term  1   Preterm      AB      Living  1      SAB      IAB      Ectopic      Multiple  0   Live Births  1            Home Medications    Prior to Admission medications   Medication Sig Start Date End Date Taking? Authorizing Provider  cyclobenzaprine (FLEXERIL) 5 MG tablet Take 1 tablet (5 mg total) by mouth 3 (three) times daily as needed for muscle spasms. Do not drink alcohol or drive while taking this medication.  May cause drowsiness. 06/02/21  Yes Particia Nearing, PA-C  OZEMPIC, 0.25 OR 0.5 MG/DOSE, 2 MG/1.5ML SOPN Inject into the skin. 03/22/21  Yes [provider]  predniSONE (DELTASONE) 20 MG tablet Take 2 tablets (40 mg total) by mouth daily with breakfast. 06/02/21  Yes Particia Nearing, PA-C  nystatin  (MYCOSTATIN/NYSTOP) powder Apply topically 4 (four) times daily. 03/22/21   [provider]    Family History Family History  Problem Relation Age of Onset   Diabetes Mother    Hyperlipidemia Mother    Stroke Mother    Hypertension Father    Cancer Paternal Grandmother    Cancer Paternal Grandfather     Social History Social History   Tobacco Use   Smoking status: Never   Smokeless tobacco: Never  Vaping Use   Vaping Use: Never used  Substance Use Topics   Alcohol use: No   Drug use: No     Allergies   Patient has no known allergies.   Review of Systems Review of Systems PER HPI  Physical Exam Triage Vital Signs ED Triage Vitals  Enc Vitals Group     BP 06/02/21 1732 128/79     Pulse Rate 06/02/21 1732 (!) 104     Resp 06/02/21 1732 16     Temp 06/02/21 1732 98.9 F (37.2 C)     Temp Source 06/02/21 1732 Oral     SpO2 06/02/21 1732 95 %     Weight --  Height --      Head Circumference --      Peak Flow --      Pain Score 06/02/21 1733 7     Pain Loc --      Pain Edu? --      Excl. in [place]? --    No data found.  Updated Vital Signs BP 128/79 (BP Location: Right Arm)   Pulse 87   Temp 98.9 F (37.2 C) (Oral)   Resp 16   LMP 05/12/2021 (Approximate)   SpO2 95%   Breastfeeding Unknown   Visual Acuity Right Eye Distance:   Left Eye Distance:   Bilateral Distance:    Right Eye Near:   Left Eye Near:    Bilateral Near:     Physical Exam Vitals and nursing note reviewed.  Constitutional:      Appearance: Normal appearance. She is not ill-appearing.  HENT:     Head: Atraumatic.  Eyes:     Extraocular Movements: Extraocular movements intact.     Conjunctiva/sclera: Conjunctivae normal.  Cardiovascular:     Rate and Rhythm: Normal rate and regular rhythm.     Heart sounds: Normal heart sounds.  Pulmonary:     Effort: Pulmonary effort is normal.     Breath sounds: Normal breath sounds. No wheezing or rales.  Musculoskeletal:         General: Tenderness (significant left upper back ttp extending down to left shoulder and left lateral pectoral muscles, pain with active ROM against resistance) present. No swelling or deformity. Normal range of motion.     Cervical back: Normal range of motion and neck supple.  Skin:    General: Skin is warm and dry.     Findings: No erythema or rash.  Neurological:     Mental Status: She is alert and oriented to person, place, and time.     Sensory: No sensory deficit.     Motor: No weakness.  Psychiatric:        Mood and Affect: Mood normal.        Thought Content: Thought content normal.        Judgment: Judgment normal.     UC Treatments / Results  Labs (all labs ordered are listed, but only abnormal results are displayed) Labs Reviewed - No data to display  EKG   Radiology No results found.  Procedures Procedures (including critical care time)  Medications Ordered in UC Medications - No data to display  Initial Impression / Assessment and Plan / UC Course  I have reviewed the triage vital signs and the nursing notes.  Pertinent labs & imaging results that were available during my care of the patient were reviewed by me and considered in my medical decision making (see chart for details).     EKG NSR without ST or T wave changes, vitals and exam overall reassuring and suggestive of radicular left arm pain from muscular strain left shoulder and lat muscles. Treat with prednisone, flexeril and heat, stretches. Return precautions reviewed at length.   Final Clinical Impressions(s) / UC Diagnoses   Final diagnoses:  Radicular pain in left arm   Discharge Instructions   None    ED Prescriptions     Medication Sig Dispense Auth. Provider   predniSONE (DELTASONE) 20 MG tablet Take 2 tablets (40 mg total) by mouth daily with breakfast. 10 tablet Volney American, PA-C   cyclobenzaprine (FLEXERIL) 5 MG tablet Take 1 tablet (5 mg total) by mouth 3 (  three)  times daily as needed for muscle spasms. Do not drink alcohol or drive while taking this medication.  May cause drowsiness. 15 tablet Volney American, [place]      PDMP not reviewed this encounter.   Volney American, [place] 06/05/21 2159

## 2021-06-08 ENCOUNTER — Emergency Department: Payer: Medicaid Other

## 2021-06-08 ENCOUNTER — Emergency Department
Admission: EM | Admit: 2021-06-08 | Discharge: 2021-06-08 | Disposition: A | Discharge status: Home / Self Care | Payer: Medicaid Other | Attending: Emergency Medicine | Admitting: Emergency Medicine

## 2021-06-08 ENCOUNTER — Other Ambulatory Visit: Payer: Self-pay

## 2021-06-08 DIAGNOSIS — R0789 Other chest pain: Secondary | ICD-10-CM | POA: Insufficient documentation

## 2021-06-08 DIAGNOSIS — K219 Gastro-esophageal reflux disease without esophagitis: Secondary | ICD-10-CM | POA: Diagnosis not present

## 2021-06-08 LAB — BASIC METABOLIC PANEL
Anion gap: 8 (ref 5–15)
BUN: 16 mg/dL (ref 6–20)
CO2: 24 mmol/L (ref 22–32)
Calcium: 9 mg/dL (ref 8.9–10.3)
Chloride: 105 mmol/L (ref 98–111)
Creatinine, Ser: 0.58 mg/dL (ref 0.44–1.00)
GFR, Estimated: >60 mL/min (ref >60)
Glucose, Bld: 95 mg/dL (ref 70–99)
Potassium: 3.4 mmol/L — ABNORMAL LOW (ref 3.5–5.1)
Sodium: 137 mmol/L (ref 135–145)

## 2021-06-08 LAB — CBC
HCT: 37.9 % (ref 36.0–46.0)
Hemoglobin: 13 g/dL (ref 12.0–15.0)
MCH: 29.2 pg (ref 26.0–34.0)
MCHC: 34.3 g/dL (ref 30.0–36.0)
MCV: 85.2 fL (ref 80.0–100.0)
Platelets: 225 10*3/uL (ref 150–400)
RBC: 4.45 MIL/uL (ref 3.87–5.11)
RDW: 13 % (ref 11.5–15.5)
WBC: 5.6 10*3/uL (ref 4.0–10.5)
nRBC: 0 % (ref 0.0–0.2)

## 2021-06-08 LAB — TROPONIN I (HIGH SENSITIVITY): Troponin I (High Sensitivity): 3 ng/L (ref <18)

## 2021-06-08 NOTE — ED Notes (Signed)
ED Provider at bedside. 

## 2021-06-08 NOTE — ED Triage Notes (Addendum)
Pt in with co pain to right chest states only when she coughs and takes a deep breath. Pt was seen at urgent care recently for same pain to left chest and was dx with a pulled muscle. Pain is reproducible upon palpation.

## 2021-06-08 NOTE — Discharge Instructions (Signed)

## 2021-06-08 NOTE — ED Provider Notes (Signed)
[facility] Provider Note  ____________________________________________   Event Date/Time   First MD Initiated Contact with Patient 06/08/21 0118     (approximate)  I have reviewed the triage vital signs and the nursing notes.   HISTORY  Chief Complaint chest wall pain    HPI Carolyn Lynch is a 35 y.o. female who presents for evaluation of chest wall pain that is been going on for about a week.  When it initially started, she said it was more in the left side of her chest but was worse with raising her arms and pushing on her chest wall as it is now.  However now the pain is mostly on the right upper part of her chest.  She went to an urgent care and was evaluated in diagnosed with chest wall pain (musculoskeletal pain) and prescribed prednisone and Flexeril.  However she states that she does not really like taking medication so she did not take more than a dose or 2 of each.  She has not been taking anything else including ibuprofen or Tylenol.  She said the pain has been consistent in the right upper part of her chest and into her upper shoulders and the right side of her back over the last several days.  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  She did not sustain any trauma for which she is aware.  She denies shortness of breath, nausea, vomiting, palpitations, abdominal pain, and dysuria.  She has no history of blood clots in the legs nor the lungs and has had no unilateral leg pain or swelling.  She is not on any sort of birth control.  She has not had any recent fever, chills, sore throat, or other upper respiratory symptoms.  She went on a trip about a month ago to [place] but her symptoms just started about a week ago.  She says the pain is sharp and reproducible with movement as described above.     Past Medical History:  Diagnosis Date    Anxiety    GERD (gastroesophageal reflux disease)    OCC   Seizure ([facility])    LAST HAD AS A CHILD   Vitamin D deficiency     Patient Active Problem List   Diagnosis Date Noted   Post-dates pregnancy 07/12/2017   Term pregnancy 07/12/2017   Encounter for supervision of normal first pregnancy in third trimester 03/27/2017   Hemorrhoids in pregnancy in second trimester 03/27/2017    Past Surgical History:  Procedure Laterality Date   WISDOM TOOTH EXTRACTION      Prior to Admission medications   Medication Sig Start Date End Date Taking? Authorizing Provider  cyclobenzaprine (FLEXERIL) 5 MG tablet Take 1 tablet (5 mg total) by mouth 3 (three) times daily as needed for muscle spasms. Do not drink alcohol or drive while taking this medication.  May cause drowsiness. 06/02/21   Particia Nearing, PA-C  nystatin (MYCOSTATIN/NYSTOP) powder Apply topically 4 (four) times daily. 03/22/21   [provider]  OZEMPIC, 0.25 OR 0.5 MG/DOSE, 2 MG/1.5ML SOPN Inject into the skin. 03/22/21   [provider]  predniSONE (DELTASONE) 20 MG tablet Take 2 tablets (40 mg total) by mouth daily with breakfast. 06/02/21   Particia Nearing, PA-C    Allergies Patient has no known allergies.  Family History  Problem Relation Age of Onset  Diabetes Mother    Hyperlipidemia Mother    Stroke Mother    Hypertension Father    Cancer Paternal Grandmother    Cancer Paternal Grandfather     Social History Social History   Tobacco Use   Smoking status: Never   Smokeless tobacco: Never  Vaping Use   Vaping Use: Never used  Substance Use Topics   Alcohol use: No   Drug use: No    Review of Systems Constitutional: No fever/chills Eyes: No visual changes. ENT: No sore throat. Cardiovascular: Positive for chest pain as described above. Respiratory: Denies shortness of breath. Gastrointestinal: No abdominal pain.  No nausea, no vomiting.  No diarrhea.  No  constipation. Genitourinary: Negative for dysuria. Musculoskeletal: Pain in chest wall and shoulder as described above.  Integumentary: Negative for rash. Neurological: Negative for headaches, focal weakness or numbness.   ____________________________________________   PHYSICAL EXAM:  VITAL SIGNS: ED Triage Vitals [06/08/21 0014]  Enc Vitals Group     BP 130/84     Pulse Rate 87     Resp 20     Temp 98.2 F (36.8 C)     Temp Source Oral     SpO2 100 %     Weight 88 kg (194 lb)     Height 1.626 m (5\' 4" )     Head Circumference      Peak Flow      Pain Score 10     Pain Loc      Pain Edu?      Excl. in [place]?     Constitutional: Alert and oriented.  Eyes: Conjunctivae are normal.  Head: Atraumatic. Nose: No congestion/rhinnorhea. Mouth/Throat: Patient is wearing a mask. Neck: No stridor.  No meningeal signs.   Cardiovascular: Normal rate, regular rhythm. Good peripheral circulation. Respiratory: Normal respiratory effort.  No retractions. Gastrointestinal: Soft and nontender. No distention.  Musculoskeletal: Highly reproducible right upper chest wall tenderness to palpation as well as with passive and active range of motion of the right shoulder. Neurologic:  Normal speech and language. No gross focal neurologic deficits are appreciated.  Skin:  Skin is warm, dry and intact. Psychiatric: Mood and affect are normal. Speech and behavior are normal.  ____________________________________________   LABS (all labs ordered are listed, but only abnormal results are displayed)  Labs Reviewed  BASIC METABOLIC PANEL - Abnormal; Notable for the following components:      Result Value   Potassium 3.4 (*)    All other components within normal limits  CBC  TROPONIN I (HIGH SENSITIVITY)  TROPONIN I (HIGH SENSITIVITY)   ____________________________________________  EKG  ED ECG REPORT I, , the attending physician, personally viewed and interpreted this  ECG.  Date: 06/08/2021 EKG Time: 00: 31 Rate: 94 Rhythm: normal sinus rhythm QRS Axis: normal Intervals: normal ST/T Wave abnormalities: normal Narrative Interpretation: no evidence of acute ischemia  ____________________________________________  RADIOLOGY I, 06/10/2021, personally viewed and evaluated these images (plain radiographs) as part of my medical decision making, as well as reviewing the written report by the radiologist.  ED MD interpretation: No acute abnormalities on chest x-ray  Official radiology report(s): DG Chest 2 View  Result Date: 06/08/2021 CLINICAL DATA:  Chest pain EXAM: CHEST - 2 VIEW COMPARISON:  02/11/2020 FINDINGS: Cardiac and mediastinal contours are within normal limits. No focal pulmonary opacity. No pleural effusion or pneumothorax. No acute osseous abnormality. IMPRESSION: No acute cardiopulmonary process. Electronically Signed   By: 02/13/2020.D.  On: 06/08/2021 00:37    ____________________________________________   PROCEDURES   Procedure(s) performed (including Critical Care):  Procedures   ____________________________________________   INITIAL IMPRESSION / MDM / ASSESSMENT AND PLAN / ED COURSE  As part of my medical decision making, I reviewed the following data within the electronic MEDICAL RECORD NUMBER Nursing notes reviewed and incorporated, Labs reviewed , EKG interpreted , Old chart reviewed, Radiograph reviewed , and Notes from prior ED visits   Differential diagnosis includes, but is not limited to, musculoskeletal strain, costochondritis, ACS, PE, pneumonia, nonspecific viral respiratory infection.  Vital signs are all stable and within normal limits including a normal heart rate and 100% SPO2 on room air.  She has no respiratory symptoms.  EKG shows no evidence of ischemia.  I personally reviewed the patient's imaging and agree with the radiologist's interpretation that there are no acute abnormalities on chest x-ray.   CBC, basic metabolic panel, and initial high-sensitivity troponin are all within normal limits other than a very slightly decreased potassium level.  There is no indication to repeat a troponin given that she has had symptoms for about a week and that she is at very low risk of ACS based on HEAR score.  Well score for PE is 0 and she is PERC negative.  I had my usual and customary D-dimer/CTA chest discussion with the patient including her very low risk.  She declines additional testing at this time because she said that even if the D-dimer was slightly elevated she would not want to get the CTA.  I encouraged her to try ibuprofen 600 mg 3 times a day with meals and I gave her my usual and customary return precautions if she develops new or worsening symptoms.  She agrees with the plan.           ____________________________________________  FINAL CLINICAL IMPRESSION(S) / ED DIAGNOSES  Final diagnoses:  Chest wall pain     MEDICATIONS GIVEN DURING THIS VISIT:  Medications - No data to display   ED Discharge Orders     None        Note:  This document was prepared using Dragon voice recognition software and may include unintentional dictation errors.   Loleta Rose, MD 06/08/21 [phone]

## 2021-10-23 ENCOUNTER — Ambulatory Visit: Payer: Medicaid Other | Admitting: Surgery

## 2021-10-23 ENCOUNTER — Encounter: Payer: Self-pay | Admitting: Surgery

## 2021-10-23 VITALS — BP 110/77 | HR 105 | Temp 98.2°F | Ht 63.0 in | Wt 183.0 lb

## 2021-10-23 DIAGNOSIS — K8020 Calculus of gallbladder without cholecystitis without obstruction: Secondary | ICD-10-CM | POA: Diagnosis not present

## 2021-10-23 NOTE — Patient Instructions (Addendum)
You have requested to have your gallbladder removed. This will be done on May 11th at [facility] with Dr. Hampton Abbot. ? ?You will most likely be out of work 1-2 weeks for this surgery.  ?If you have FMLA or disability paperwork that needs filled out you may drop this off at our office or this can be faxed to [phone]. ? ?You will return after your post-op appointment with a lifting restriction for approximately 4 more weeks. ? ?You will be able to eat anything you would like to following surgery. But, start by eating a bland diet and advance this as tolerated. The Gallbladder diet is below, please go as closely by this diet as possible prior to surgery to avoid any further attacks. ? ?Please see the (blue)pre-care form that you have been given today. Our surgery scheduler will call you to verify surgery date and to go over information.  ? ?If you have any questions, please call our office. ? ?Laparoscopic Cholecystectomy ?Laparoscopic cholecystectomy is surgery to remove the gallbladder. The gallbladder is located in the upper right part of the abdomen, behind the liver. It is a storage sac for bile, which is produced in the liver. Bile aids in the digestion and absorption of fats. Cholecystectomy is often done for inflammation of the gallbladder (cholecystitis). This condition is usually caused by a buildup of gallstones (cholelithiasis) in the gallbladder. Gallstones can block the flow of bile, and that can result in inflammation and pain. In severe cases, emergency surgery may be required. If emergency surgery is not required, you will have time to prepare for the procedure. ?Laparoscopic surgery is an alternative to open surgery. Laparoscopic surgery has a shorter recovery time. Your common bile duct may also need to be examined during the procedure. If stones are found in the common bile duct, they may be removed. ?LET [facility] CARE PROVIDER KNOW ABOUT: ?Any allergies you have. ?All medicines you  are taking, including vitamins, herbs, eye drops, creams, and over-the-counter medicines. ?Previous problems you or members of your family have had with the use of anesthetics. ?Any blood disorders you have. ?Previous surgeries you have had. ? ?Any medical conditions you have. ?RISKS AND COMPLICATIONS ?Generally, this is a safe procedure. However, problems may occur, including: ?Infection. ?Bleeding. ?Allergic reactions to medicines. ?Damage to other structures or organs. ?A stone remaining in the common bile duct. ?A bile leak from the cyst duct that is clipped when your gallbladder is removed. ?The need to convert to open surgery, which requires a larger incision in the abdomen. This may be necessary if your surgeon thinks that it is not safe to continue with a laparoscopic procedure. ?BEFORE THE PROCEDURE ?Ask your health care provider about: ?Changing or stopping your regular medicines. This is especially important if you are taking diabetes medicines or blood thinners. ?Taking medicines such as aspirin and ibuprofen. These medicines can thin your blood. Do not take these medicines before your procedure if your health care provider instructs you not to. ?Follow instructions from your health care provider about eating or drinking restrictions. ?Let your health care provider know if you develop a cold or an infection before surgery. ?Plan to have someone take you home after the procedure. ?Ask your health care provider how your surgical site will be marked or identified. ?You may be given antibiotic medicine to help prevent infection. ?PROCEDURE ?To reduce your risk of infection: ?Your health care team will wash or sanitize their hands. ?Your skin will be washed with  soap. ?An IV tube may be inserted into one of your veins. ?You will be given a medicine to make you fall asleep (general anesthetic). ?A breathing tube will be placed in your mouth. ?The surgeon will make several small cuts (incisions) in your  abdomen. ?A thin, lighted tube (laparoscope) that has a tiny camera on the end will be inserted through one of the small incisions. The camera on the laparoscope will send a picture to a TV screen (monitor) in the operating room. This will give the surgeon a good view inside your abdomen. ?A gas will be pumped into your abdomen. This will expand your abdomen to give the surgeon more room to perform the surgery. ?Other tools that are needed for the procedure will be inserted through the other incisions. The gallbladder will be removed through one of the incisions. ?After your gallbladder has been removed, the incisions will be closed with stitches (sutures), staples, or skin glue. ?Your incisions may be covered with a bandage (dressing). ?The procedure may vary among health care providers and hospitals. ?AFTER THE PROCEDURE ?Your blood pressure, heart rate, breathing rate, and blood oxygen level will be monitored often until the medicines you were given have worn off. ?You will be given medicines as needed to control your pain. ?  ?This information is not intended to replace advice given to you by your health care provider. Make sure you discuss any questions you have with your health care provider. ?  ?Document Released: 06/11/2005 Document Revised: 03/02/2015 Document Reviewed: 01/21/2013 ?Elsevier Interactive Patient Education ?2016 [place]. ? ? ?Low-Fat Diet for Gallbladder Conditions ?A low-fat diet can be helpful if you have pancreatitis or a gallbladder condition. With these conditions, your pancreas and gallbladder have trouble digesting fats. A healthy eating plan with less fat will help rest your pancreas and gallbladder and reduce your symptoms. ?WHAT DO I NEED TO KNOW ABOUT THIS DIET? ?Eat a low-fat diet. ?Reduce your fat intake to less than 20-30% of your total daily calories. This is less than 50-60 g of fat per day. ?Remember that you need some fat in your diet. Ask your dietician what your daily  goal should be. ?Choose nonfat and low-fat healthy foods. Look for the words "nonfat," "low fat," or "fat free." ?As a guide, look on the label and choose foods with less than 3 g of fat per serving. Eat only one serving. ?Avoid alcohol. ?Do not smoke. If you need help quitting, talk with your health care provider. ?Eat small frequent meals instead of three large heavy meals. ?WHAT FOODS CAN I EAT? ?Grains ?Include healthy grains and starches such as potatoes, wheat bread, fiber-rich cereal, and brown rice. Choose whole grain options whenever possible. In adults, whole grains should account for 45-65% of your daily calories.  ?Fruits and Vegetables ?Eat plenty of fruits and vegetables. Fresh fruits and vegetables add fiber to your diet. ?Meats and Other Protein Sources ?Eat lean meat such as chicken and pork. Trim any fat off of meat before cooking it. Eggs, fish, and beans are other sources of protein. In adults, these foods should account for 10-35% of your daily calories. ?Dairy ?Choose low-fat milk and dairy options. Dairy includes fat and protein, as well as calcium.  ?Fats and Oils ?Limit high-fat foods such as fried foods, sweets, baked goods, sugary drinks.  ?Other ?Creamy sauces and condiments, such as mayonnaise, can add extra fat. Think about whether or not you need to use them, or use smaller amounts or  low fat options. ?WHAT FOODS ARE NOT RECOMMENDED? ?High fat foods, such as: ?[employer]. ?Ice cream. ?[place] toast. ?Sweet rolls. ?Pizza. ?Cheese bread. ?Foods covered with batter, butter, creamy sauces, or cheese. ?Fried foods. ?Sugary drinks and desserts. ?Foods that cause gas or bloating ?  ?This information is not intended to replace advice given to you by your health care provider. Make sure you discuss any questions you have with your health care provider. ?  ?Document Released: 06/16/2013 Document Reviewed: 06/16/2013 ?Elsevier Interactive Patient Education ?2016 [place]. ? ? ?

## 2021-10-23 NOTE — Progress Notes (Signed)
?10/23/2021 ? ?History of Present Illness: ?Carolyn Lynch is a 36 y.o. female presenting for follow-up of symptomatic cholelithiasis.  The patient has been scheduled for surgery on 06/02/2020 but she canceled surgery at that point.  She reports that she had been doing okay until more recently particular the last month that she has had 2 episodes of biliary colic.  She reported the episodes lasted only a couple of hours but were associated with nausea and pain in the right upper quadrant.  The most recent episode happened after eating a hot dog.  Denies any fevers, chills, chest pain, shortness of breath, jaundice.  She feels that now she is ready for surgery and would like to schedule. ? ?Past Medical History: ?Past Medical History:  ?Diagnosis Date  ? Anxiety   ? GERD (gastroesophageal reflux disease)   ? OCC  ? Seizure (HCC)   ? LAST HAD AS A CHILD  ? Vitamin D deficiency   ?  ? ?Past Surgical History: ?Past Surgical History:  ?Procedure Laterality Date  ? WISDOM TOOTH EXTRACTION    ? ? ?Home Medications: ?Prior to Admission medications   ?Medication Sig Start Date End Date Taking? Authorizing Provider  ?nystatin (MYCOSTATIN/NYSTOP) powder Apply topically 4 (four) times daily. 03/22/21  Yes [provider]  ?OZEMPIC, 0.25 OR 0.5 MG/DOSE, 2 MG/1.5ML SOPN Inject into the skin. 03/22/21  Yes [provider]  ? ? ?Allergies: ?No Known Allergies ? ?Review of Systems: ?Review of Systems  ?Constitutional:  Negative for chills and fever.  ?HENT:  Negative for hearing loss.   ?Respiratory:  Negative for cough and shortness of breath.   ?Cardiovascular:  Negative for chest pain.  ?Gastrointestinal:  Positive for abdominal pain and nausea. Negative for constipation, diarrhea and vomiting.  ?Genitourinary:  Negative for dysuria.  ?Musculoskeletal:  Negative for myalgias.  ?Skin:  Negative for rash.  ?Neurological:  Negative for dizziness.  ?Psychiatric/Behavioral:  Negative for depression.   ? ?Physical Exam ?BP  110/77   Pulse (!) 105   Temp 98.2 ?F (36.8 ?C)   Ht 5\' 3"  (1.6 m)   Wt 183 lb (83 kg)   LMP 10/12/2021 (Exact Date)   SpO2 98%   BMI 32.42 kg/m?  ?CONSTITUTIONAL: No acute distress, well-nourished ?HEENT:  Normocephalic, atraumatic, extraocular motion intact. ?NECK: Trachea is midline, no jugular venous distention. ?RESPIRATORY:  Lungs are clear, and breath sounds are equal bilaterally. Normal respiratory effort without pathologic use of accessory muscles. ?CARDIOVASCULAR: Heart is regular without murmurs, gallops, or rubs. ?GI: The abdomen is soft, nondistended, currently nontender to palpation.  Negative Murphy sign. ?MUSCULOSKELETAL: Normal gait, no peripheral edema. ?NEUROLOGIC:  Motor and sensation is grossly normal.  Cranial nerves are grossly intact. ?PSYCH:  Alert and oriented to person, place and time. Affect is normal. ? ?Labs/Imaging: ?Labs from 06/08/2021: ?Sodium 137, potassium 3.4, chloride 105, CO2 24, BUN 16, creatinine 0.58.  CBC 5.6, hemoglobin 13, hematocrit 37.9, platelets 225. ? ?Ultrasound abdomen on 08/05/2019: ?IMPRESSION: ?1. No acute findings. ?2. Gallstones without evidence of acute cholecystitis. No bile duct ?dilation. ?3. Liver appearance consistent with diffuse hepatic steatosis. ?  ? ?Assessment and Plan: ?This is a 36 y.o. female with symptomatic cholelithiasis. ? ?- Discussed with patient the plan will still be the same as compared to last time that we have schedule her which will consist on a robotic assisted cholecystectomy with ICG cholangiogram.  Reviewed with the patient the surgery at length including the risks of bleeding, infection, injury to surrounding structures,  the use of ICG to better evaluate the biliary anatomy, postoperative activity restrictions, pain control, that this an outpatient surgery, and she is willing to proceed. ?-We will schedule the patient for surgery on 11/02/2021.  All of her questions have been answered. ? ?I spent 40 minutes dedicated to the  care of this patient on the date of this encounter to include pre-visit review of records, face-to-face time with the patient discussing diagnosis and management, and any post-visit coordination of care. ? ? ?Melvyn Neth, MD ?[facility] ? ? ?  ?

## 2021-10-23 NOTE — H&P (View-Only) (Signed)
?10/23/2021 ? ?History of Present Illness: ?Carolyn Lynch is a 36 y.o. female presenting for follow-up of symptomatic cholelithiasis.  The patient has been scheduled for surgery on 06/02/2020 but she canceled surgery at that point.  She reports that she had been doing okay until more recently particular the last month that she has had 2 episodes of biliary colic.  She reported the episodes lasted only a couple of hours but were associated with nausea and pain in the right upper quadrant.  The most recent episode happened after eating a hot dog.  Denies any fevers, chills, chest pain, shortness of breath, jaundice.  She feels that now she is ready for surgery and would like to schedule. ? ?Past Medical History: ?Past Medical History:  ?Diagnosis Date  ? Anxiety   ? GERD (gastroesophageal reflux disease)   ? OCC  ? Seizure (HCC)   ? LAST HAD AS A CHILD  ? Vitamin D deficiency   ?  ? ?Past Surgical History: ?Past Surgical History:  ?Procedure Laterality Date  ? WISDOM TOOTH EXTRACTION    ? ? ?Home Medications: ?Prior to Admission medications   ?Medication Sig Start Date End Date Taking? Authorizing Provider  ?nystatin (MYCOSTATIN/NYSTOP) powder Apply topically 4 (four) times daily. 03/22/21  Yes [provider]  ?OZEMPIC, 0.25 OR 0.5 MG/DOSE, 2 MG/1.5ML SOPN Inject into the skin. 03/22/21  Yes [provider]  ? ? ?Allergies: ?No Known Allergies ? ?Review of Systems: ?Review of Systems  ?Constitutional:  Negative for chills and fever.  ?HENT:  Negative for hearing loss.   ?Respiratory:  Negative for cough and shortness of breath.   ?Cardiovascular:  Negative for chest pain.  ?Gastrointestinal:  Positive for abdominal pain and nausea. Negative for constipation, diarrhea and vomiting.  ?Genitourinary:  Negative for dysuria.  ?Musculoskeletal:  Negative for myalgias.  ?Skin:  Negative for rash.  ?Neurological:  Negative for dizziness.  ?Psychiatric/Behavioral:  Negative for depression.   ? ?Physical Exam ?BP  110/77   Pulse (!) 105   Temp 98.2 ?F (36.8 ?C)   Ht 5' 3" (1.6 m)   Wt 183 lb (83 kg)   LMP 10/12/2021 (Exact Date)   SpO2 98%   BMI 32.42 kg/m?  ?CONSTITUTIONAL: No acute distress, well-nourished ?HEENT:  Normocephalic, atraumatic, extraocular motion intact. ?NECK: Trachea is midline, no jugular venous distention. ?RESPIRATORY:  Lungs are clear, and breath sounds are equal bilaterally. Normal respiratory effort without pathologic use of accessory muscles. ?CARDIOVASCULAR: Heart is regular without murmurs, gallops, or rubs. ?GI: The abdomen is soft, nondistended, currently nontender to palpation.  Negative Murphy sign. ?MUSCULOSKELETAL: Normal gait, no peripheral edema. ?NEUROLOGIC:  Motor and sensation is grossly normal.  Cranial nerves are grossly intact. ?PSYCH:  Alert and oriented to person, place and time. Affect is normal. ? ?Labs/Imaging: ?Labs from 06/08/2021: ?Sodium 137, potassium 3.4, chloride 105, CO2 24, BUN 16, creatinine 0.58.  CBC 5.6, hemoglobin 13, hematocrit 37.9, platelets 225. ? ?Ultrasound abdomen on 08/05/2019: ?IMPRESSION: ?1. No acute findings. ?2. Gallstones without evidence of acute cholecystitis. No bile duct ?dilation. ?3. Liver appearance consistent with diffuse hepatic steatosis. ?  ? ?Assessment and Plan: ?This is a 36 y.o. female with symptomatic cholelithiasis. ? ?- Discussed with patient the plan will still be the same as compared to last time that we have schedule her which will consist on a robotic assisted cholecystectomy with ICG cholangiogram.  Reviewed with the patient the surgery at length including the risks of bleeding, infection, injury to surrounding structures,   the use of ICG to better evaluate the biliary anatomy, postoperative activity restrictions, pain control, that this an outpatient surgery, and she is willing to proceed. ?-We will schedule the patient for surgery on 11/02/2021.  All of her questions have been answered. ? ?I spent 40 minutes dedicated to the  care of this patient on the date of this encounter to include pre-visit review of records, face-to-face time with the patient discussing diagnosis and management, and any post-visit coordination of care. ? ? ?Melvyn Neth, MD ?[facility] ? ? ?  ?

## 2021-10-24 ENCOUNTER — Telehealth: Payer: Self-pay | Admitting: Surgery

## 2021-10-24 IMAGING — DX DG CHEST 1V PORT
1 series · 1 of 1 positions shown · non-contrast
Comparison: None.

CLINICAL DATA: Chest pain

EXAM:
PORTABLE CHEST 1 VIEW

[chest ap]
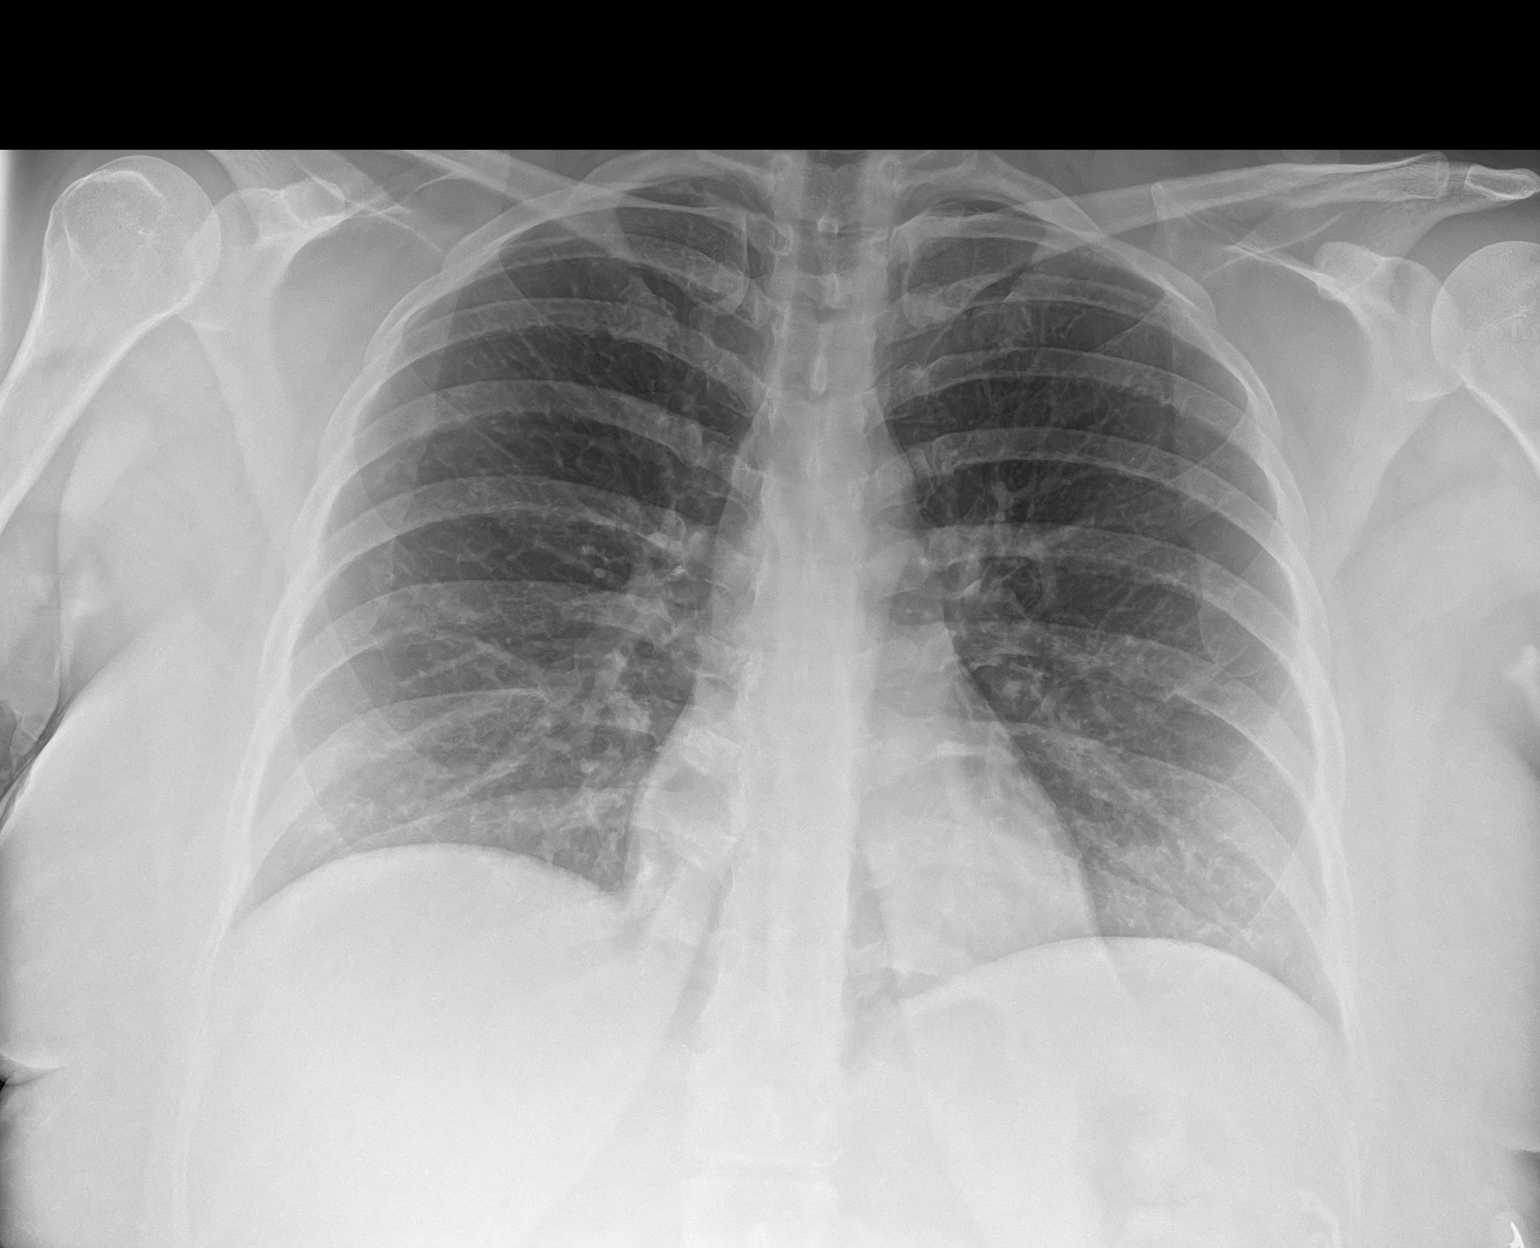

[1 of 1 positions shown; findings below may reference images not displayed]

FINDINGS: The heart size and mediastinal contours are within normal limits.
Both lungs are clear. The visualized skeletal structures are
unremarkable.
IMPRESSION: No active disease.

## 2021-10-24 NOTE — Telephone Encounter (Signed)
Patient has been advised of Pre-Admission date/time, COVID Testing date and Surgery date. ? ?Surgery Date: 11/02/21 ?Preadmission Testing Date: 10/27/21 (phone 1p-5p) ?Covid Testing Date: Not needed.    ? ?Patient has been made aware to call [phone], between 1-3:00pm the day before surgery, to find out what time to arrive for surgery.   ? ?

## 2021-10-27 ENCOUNTER — Other Ambulatory Visit
Admission: RE | Admit: 2021-10-27 | Discharge: 2021-10-27 | Disposition: A | Discharge status: Home / Self Care | Payer: Medicaid Other | Source: Ambulatory Visit | Attending: Surgery | Admitting: Surgery

## 2021-10-27 ENCOUNTER — Other Ambulatory Visit: Payer: Self-pay

## 2021-10-27 HISTORY — DX: Headache, unspecified: R51.9

## 2021-10-27 NOTE — Patient Instructions (Signed)
Your procedure is scheduled on: 11/02/21 - THURSDAY ?Report to the Registration Desk on the 1st floor of the Medical Mall. ?To find out your arrival time, please call [phone] between 1PM - 3PM on: 11/01/21 [facility] ?If your arrival time is 6:00 am, do not arrive prior to that time as the [facility] Mall entrance doors do not open until 6:00 am. ? ?REMEMBER: ?Instructions that are not followed completely may result in serious medical risk, up to and including death; or upon the discretion of your surgeon and anesthesiologist your surgery may need to be rescheduled. ? ?Do not eat food after midnight the night before surgery.  ?No gum chewing, lozengers or hard candies. ? ?You may however, drink CLEAR liquids up to 2 hours before you are scheduled to arrive for your surgery. Do not drink anything within 2 hours of your scheduled arrival time. ? ?Clear liquids include: ?- water  ?- apple juice without pulp ?- gatorade (not RED colors) ?- black coffee or tea (Do NOT add milk or creamers to the coffee or tea) ?Do NOT drink anything that is not on this list. ? ?TAKE THESE MEDICATIONS THE MORNING OF SURGERY WITH A SIP OF WATER: none ? ?Stop Anti-inflammatories (NSAIDS) such as Advil, Aleve, Ibuprofen, Motrin, Naproxen, Naprosyn and Aspirin based products such as Excedrin, [place] Powder, BC Powder. ? ?Stop ANY OVER THE COUNTER supplements until after surgery. ? ?You may  take Tylenol if needed for pain up until the day of surgery. ? ?No Alcohol for 24 hours before or after surgery. ? ?No Smoking including e-cigarettes for 24 hours prior to surgery.  ?No chewable tobacco products for at least 6 hours prior to surgery.  ?No nicotine patches on the day of surgery. ? ?Do not use any "recreational" drugs for at least a week prior to your surgery.  ?Please be advised that the combination of cocaine and anesthesia may have negative outcomes, up to and including death. ?If you test positive for cocaine, your surgery will be  cancelled. ? ?On the morning of surgery brush your teeth with toothpaste and water, you may rinse your mouth with mouthwash if you wish. ?Do not swallow any toothpaste or mouthwash. ? ?Do not wear jewelry, make-up, hairpins, clips or nail polish. ? ?Do not wear lotions, powders, or perfumes.  ? ?Do not shave body from the neck down 48 hours prior to surgery just in case you cut yourself which could leave a site for infection.  ?Also, freshly shaved skin may become irritated if using the CHG soap. ? ?Contact lenses, hearing aids and dentures may not be worn into surgery. ? ?Do not bring valuables to the hospital. [facility] is not responsible for any missing/lost belongings or valuables.  ? ?Notify your doctor if there is any change in your medical condition (cold, fever, infection). ? ?Wear comfortable clothing (specific to your surgery type) to the hospital. ? ?After surgery, you can help prevent lung complications by doing breathing exercises.  ?Take deep breaths and cough every 1-2 hours. Your doctor may order a device called an Incentive Spirometer to help you take deep breaths. ?When coughing or sneezing, hold a pillow firmly against your incision with both hands. This is called ?splinting.? Doing this helps protect your incision. It also decreases belly discomfort. ? ?If you are being admitted to the hospital overnight, leave your suitcase in the car. ?After surgery it may be brought to your room. ? ?If you are being discharged the day of  surgery, you will not be allowed to drive home. ?You will need a responsible adult (18 years or older) to drive you home and stay with you that night.  ? ?If you are taking public transportation, you will need to have a responsible adult (18 years or older) with you. ?Please confirm with your physician that it is acceptable to use public transportation.  ? ?Please call the Pre-admissions Testing Dept. at [phone] if you have any questions about these  instructions. ? ?Surgery Visitation Policy: ? ?Patients undergoing a surgery or procedure may have two family members or support persons with them as long as the person is not COVID-19 positive or experiencing its symptoms.  ? ?Inpatient Visitation:   ? ?Visiting hours are 7 a.m. to 8 p.m. ?Up to four visitors are allowed at one time in a patient room, including children. The visitors may rotate out with other people during the day. One designated support person (adult) may remain overnight.  ?

## 2021-11-01 MED ORDER — CHLORHEXIDINE GLUCONATE CLOTH 2 % EX PADS
6.0000 | MEDICATED_PAD | Freq: Once | CUTANEOUS | Status: AC
  Administered 2021-11-02: 6 via TOPICAL

## 2021-11-01 MED ORDER — GABAPENTIN 300 MG PO CAPS: 300.0000 mg | ORAL_CAPSULE | ORAL | Status: AC

## 2021-11-01 MED ORDER — CEFAZOLIN SODIUM-DEXTROSE 2-4 GM/100ML-% IV SOLN
2.0000 g | INTRAVENOUS | Status: AC
  Administered 2021-11-02: 2 g via INTRAVENOUS

## 2021-11-01 MED ORDER — CHLORHEXIDINE GLUCONATE 0.12 % MT SOLN
15.0000 mL | Freq: Once | OROMUCOSAL | Status: AC
Start: 2021-11-01 — End: 2021-11-02

## 2021-11-01 MED ORDER — FAMOTIDINE 20 MG PO TABS: 20.0000 mg | ORAL_TABLET | Freq: Once | ORAL | Status: AC

## 2021-11-01 MED ORDER — ACETAMINOPHEN 500 MG PO TABS: 1000.0000 mg | ORAL_TABLET | ORAL | Status: AC

## 2021-11-01 MED ORDER — INDOCYANINE GREEN 25 MG IV SOLR
2.5000 mg | INTRAVENOUS | Status: AC
  Administered 2021-11-02: 2.5 mg via INTRAVENOUS
  Filled 2021-11-01: qty 1

## 2021-11-01 MED ORDER — LACTATED RINGERS IV SOLN: INTRAVENOUS | Status: DC

## 2021-11-01 MED ORDER — ORAL CARE MOUTH RINSE: 15.0000 mL | Freq: Once | OROMUCOSAL | Status: AC

## 2021-11-02 ENCOUNTER — Other Ambulatory Visit: Payer: Self-pay

## 2021-11-02 ENCOUNTER — Ambulatory Visit: Payer: Medicaid Other | Admitting: Anesthesiology

## 2021-11-02 ENCOUNTER — Encounter
Admission: RE | Disposition: A | Discharge status: Home / Self Care | Payer: Self-pay | Source: Home / Self Care | Attending: Surgery

## 2021-11-02 ENCOUNTER — Ambulatory Visit
Admission: RE | Admit: 2021-11-02 | Discharge: 2021-11-02 | Disposition: A | Discharge status: Home / Self Care | Payer: Medicaid Other | Attending: Surgery | Admitting: Surgery

## 2021-11-02 ENCOUNTER — Encounter: Payer: Self-pay | Admitting: Surgery

## 2021-11-02 DIAGNOSIS — K8020 Calculus of gallbladder without cholecystitis without obstruction: Secondary | ICD-10-CM | POA: Diagnosis present

## 2021-11-02 DIAGNOSIS — Z6832 Body mass index (BMI) 32.0-32.9, adult: Secondary | ICD-10-CM | POA: Insufficient documentation

## 2021-11-02 DIAGNOSIS — E669 Obesity, unspecified: Secondary | ICD-10-CM | POA: Insufficient documentation

## 2021-11-02 DIAGNOSIS — K8010 Calculus of gallbladder with chronic cholecystitis without obstruction: Secondary | ICD-10-CM | POA: Insufficient documentation

## 2021-11-02 LAB — POCT PREGNANCY, URINE: Preg Test, Ur: NEGATIVE

## 2021-11-02 SURGERY — CHOLECYSTECTOMY, ROBOT-ASSISTED, LAPAROSCOPIC
Anesthesia: General

## 2021-11-02 MED ORDER — FENTANYL CITRATE (PF) 100 MCG/2ML IJ SOLN
INTRAMUSCULAR | Status: AC
Start: 2021-11-02 — End: ?
  Filled 2021-11-02: qty 2

## 2021-11-02 MED ORDER — OXYCODONE HCL 5 MG PO TABS
ORAL_TABLET | ORAL | Status: AC
  Administered 2021-11-02: 5 mg via ORAL
  Filled 2021-11-02: qty 1

## 2021-11-02 MED ORDER — ACETAMINOPHEN 10 MG/ML IV SOLN: 1000.0000 mg | Freq: Once | INTRAVENOUS | Status: DC | PRN

## 2021-11-02 MED ORDER — 0.9 % SODIUM CHLORIDE (POUR BTL) OPTIME
TOPICAL | Status: DC | PRN
  Administered 2021-11-02: 1000 mL

## 2021-11-02 MED ORDER — BUPIVACAINE-EPINEPHRINE (PF) 0.25% -1:200000 IJ SOLN
INTRAMUSCULAR | Status: DC | PRN
  Administered 2021-11-02: 30 mL

## 2021-11-02 MED ORDER — SUCCINYLCHOLINE CHLORIDE 200 MG/10ML IV SOSY
PREFILLED_SYRINGE | INTRAVENOUS | Status: DC | PRN
  Administered 2021-11-02: 100 mg via INTRAVENOUS

## 2021-11-02 MED ORDER — ACETAMINOPHEN 500 MG PO TABS
ORAL_TABLET | ORAL | Status: AC
  Administered 2021-11-02: 1000 mg via ORAL
  Filled 2021-11-02: qty 2

## 2021-11-02 MED ORDER — OXYCODONE HCL 5 MG PO TABS
ORAL_TABLET | ORAL | Status: AC
  Filled 2021-11-02: qty 1

## 2021-11-02 MED ORDER — FENTANYL CITRATE (PF) 100 MCG/2ML IJ SOLN
INTRAMUSCULAR | Status: AC
  Filled 2021-11-02: qty 2

## 2021-11-02 MED ORDER — DEXAMETHASONE SODIUM PHOSPHATE 10 MG/ML IJ SOLN
INTRAMUSCULAR | Status: DC | PRN
  Administered 2021-11-02: 8 mg via INTRAVENOUS

## 2021-11-02 MED ORDER — SUGAMMADEX SODIUM 200 MG/2ML IV SOLN
INTRAVENOUS | Status: DC | PRN
  Administered 2021-11-02: 200 mg via INTRAVENOUS

## 2021-11-02 MED ORDER — IBUPROFEN 800 MG PO TABS: 800.0000 mg | ORAL_TABLET | Freq: Three times a day (TID) | ORAL | 1 refills | Status: AC | PRN

## 2021-11-02 MED ORDER — MIDAZOLAM HCL 2 MG/2ML IJ SOLN
INTRAMUSCULAR | Status: AC
  Filled 2021-11-02: qty 2

## 2021-11-02 MED ORDER — FAMOTIDINE 20 MG PO TABS
ORAL_TABLET | ORAL | Status: AC
  Administered 2021-11-02: 20 mg via ORAL
  Filled 2021-11-02: qty 1

## 2021-11-02 MED ORDER — PROPOFOL 10 MG/ML IV BOLUS
INTRAVENOUS | Status: AC
  Filled 2021-11-02: qty 40

## 2021-11-02 MED ORDER — OXYCODONE HCL 5 MG PO TABS
5.0000 mg | ORAL_TABLET | Freq: Once | ORAL | Status: AC | PRN
  Administered 2021-11-02: 5 mg via ORAL

## 2021-11-02 MED ORDER — OXYCODONE HCL 5 MG PO TABS: 5.0000 mg | ORAL_TABLET | ORAL | 0 refills | Status: DC | PRN

## 2021-11-02 MED ORDER — CHLORHEXIDINE GLUCONATE 0.12 % MT SOLN
OROMUCOSAL | Status: AC
  Administered 2021-11-02: 15 mL via OROMUCOSAL
  Filled 2021-11-02: qty 15

## 2021-11-02 MED ORDER — ACETAMINOPHEN 500 MG PO TABS: 1000.0000 mg | ORAL_TABLET | Freq: Four times a day (QID) | ORAL | Status: AC | PRN

## 2021-11-02 MED ORDER — PHENYLEPHRINE 80 MCG/ML (10ML) SYRINGE FOR IV PUSH (FOR BLOOD PRESSURE SUPPORT)
PREFILLED_SYRINGE | INTRAVENOUS | Status: AC
  Filled 2021-11-02: qty 20

## 2021-11-02 MED ORDER — FENTANYL CITRATE (PF) 100 MCG/2ML IJ SOLN
INTRAMUSCULAR | Status: DC | PRN
  Administered 2021-11-02 (×2): 50 ug via INTRAVENOUS

## 2021-11-02 MED ORDER — GABAPENTIN 300 MG PO CAPS
ORAL_CAPSULE | ORAL | Status: AC
  Administered 2021-11-02: 300 mg via ORAL
  Filled 2021-11-02: qty 1

## 2021-11-02 MED ORDER — ROCURONIUM BROMIDE 10 MG/ML (PF) SYRINGE
PREFILLED_SYRINGE | INTRAVENOUS | Status: AC
Start: 2021-11-02 — End: ?
  Filled 2021-11-02: qty 30

## 2021-11-02 MED ORDER — ROCURONIUM BROMIDE 100 MG/10ML IV SOLN
INTRAVENOUS | Status: DC | PRN
  Administered 2021-11-02: 10 mg via INTRAVENOUS
  Administered 2021-11-02: 50 mg via INTRAVENOUS

## 2021-11-02 MED ORDER — MIDAZOLAM HCL 2 MG/2ML IJ SOLN
INTRAMUSCULAR | Status: DC | PRN
  Administered 2021-11-02: 2 mg via INTRAVENOUS

## 2021-11-02 MED ORDER — PROPOFOL 10 MG/ML IV BOLUS
INTRAVENOUS | Status: AC
  Filled 2021-11-02: qty 60

## 2021-11-02 MED ORDER — ONDANSETRON HCL 4 MG/2ML IJ SOLN
INTRAMUSCULAR | Status: DC | PRN
  Administered 2021-11-02: 4 mg via INTRAVENOUS

## 2021-11-02 MED ORDER — PHENYLEPHRINE HCL (PRESSORS) 10 MG/ML IV SOLN
INTRAVENOUS | Status: AC
  Filled 2021-11-02: qty 1

## 2021-11-02 MED ORDER — LIDOCAINE HCL (CARDIAC) PF 100 MG/5ML IV SOSY
PREFILLED_SYRINGE | INTRAVENOUS | Status: DC | PRN
  Administered 2021-11-02: 80 mg via INTRAVENOUS

## 2021-11-02 MED ORDER — FENTANYL CITRATE (PF) 100 MCG/2ML IJ SOLN
25.0000 ug | INTRAMUSCULAR | Status: DC | PRN
  Administered 2021-11-02: 25 ug via INTRAVENOUS

## 2021-11-02 MED ORDER — LACTATED RINGERS IV SOLN: INTRAVENOUS | Status: DC

## 2021-11-02 MED ORDER — CEFAZOLIN SODIUM-DEXTROSE 2-4 GM/100ML-% IV SOLN
INTRAVENOUS | Status: AC
  Filled 2021-11-02: qty 100

## 2021-11-02 MED ORDER — ONDANSETRON HCL 4 MG/2ML IJ SOLN: 4.0000 mg | Freq: Once | INTRAMUSCULAR | Status: DC | PRN

## 2021-11-02 MED ORDER — PROPOFOL 10 MG/ML IV BOLUS
INTRAVENOUS | Status: DC | PRN
  Administered 2021-11-02: 200 mg via INTRAVENOUS

## 2021-11-02 MED ORDER — OXYCODONE HCL 5 MG/5ML PO SOLN: 5.0000 mg | Freq: Once | ORAL | Status: AC | PRN

## 2021-11-02 MED ORDER — BUPIVACAINE-EPINEPHRINE (PF) 0.25% -1:200000 IJ SOLN
INTRAMUSCULAR | Status: AC
  Filled 2021-11-02: qty 30

## 2021-11-02 MED ORDER — KETOROLAC TROMETHAMINE 30 MG/ML IJ SOLN
INTRAMUSCULAR | Status: DC | PRN
Start: 2021-11-02 — End: 2021-11-02
  Administered 2021-11-02: 30 mg via INTRAVENOUS

## 2021-11-02 MED ORDER — OXYCODONE HCL 5 MG PO TABS: 5.0000 mg | ORAL_TABLET | Freq: Once | ORAL | Status: AC

## 2021-11-02 SURGICAL SUPPLY — 57 items
ADH SKN CLS APL DERMABOND .7 (GAUZE/BANDAGES/DRESSINGS) ×1
BAG INFUSER PRESSURE 100CC (MISCELLANEOUS) IMPLANT
BAG RETRIEVAL 10 (BASKET) ×1
CANNULA REDUC XI 12-8 STAPL (CANNULA) ×1
CANNULA REDUCER 12-8 DVNC XI (CANNULA) ×1 IMPLANT
CLIP LIGATING HEMO O LOK GREEN (MISCELLANEOUS) ×2 IMPLANT
CUP MEDICINE 2OZ PLAST GRAD ST (MISCELLANEOUS) ×2 IMPLANT
DERMABOND ADVANCED (GAUZE/BANDAGES/DRESSINGS) ×1
DERMABOND ADVANCED .7 DNX12 (GAUZE/BANDAGES/DRESSINGS) ×1 IMPLANT
DRAPE ARM DVNC X/XI (DISPOSABLE) ×4 IMPLANT
DRAPE COLUMN DVNC XI (DISPOSABLE) ×1 IMPLANT
DRAPE DA VINCI XI ARM (DISPOSABLE) ×4
DRAPE DA VINCI XI COLUMN (DISPOSABLE) ×1
ELECT CAUTERY BLADE TIP 2.5 (TIP) ×2
ELECT REM PT RETURN 9FT ADLT (ELECTROSURGICAL) ×2
ELECTRODE CAUTERY BLDE TIP 2.5 (TIP) ×1 IMPLANT
ELECTRODE REM PT RTRN 9FT ADLT (ELECTROSURGICAL) ×1 IMPLANT
GLOVE SURG SYN 7.0 (GLOVE) ×4 IMPLANT
GLOVE SURG SYN 7.0 PF PI (GLOVE) ×2 IMPLANT
GLOVE SURG SYN 7.5  E (GLOVE) ×2
GLOVE SURG SYN 7.5 E (GLOVE) ×2 IMPLANT
GLOVE SURG SYN 7.5 PF PI (GLOVE) ×2 IMPLANT
GOWN STRL REUS W/ TWL LRG LVL3 (GOWN DISPOSABLE) ×4 IMPLANT
GOWN STRL REUS W/TWL LRG LVL3 (GOWN DISPOSABLE) ×8
IRRIGATOR SUCT 8 DISP DVNC XI (IRRIGATION / IRRIGATOR) IMPLANT
IRRIGATOR SUCTION 8MM XI DISP (IRRIGATION / IRRIGATOR)
IV NS 1000ML (IV SOLUTION)
IV NS 1000ML BAXH (IV SOLUTION) IMPLANT
KIT PINK PAD W/HEAD ARE REST (MISCELLANEOUS) ×2
KIT PINK PAD W/HEAD ARM REST (MISCELLANEOUS) ×1 IMPLANT
LABEL OR SOLS (LABEL) ×2 IMPLANT
MANIFOLD NEPTUNE II (INSTRUMENTS) ×2 IMPLANT
NEEDLE HYPO 22GX1.5 SAFETY (NEEDLE) ×2 IMPLANT
NS IRRIG 500ML POUR BTL (IV SOLUTION) ×2 IMPLANT
OBTURATOR OPTICAL STANDARD 8MM (TROCAR) ×1
OBTURATOR OPTICAL STND 8 DVNC (TROCAR) ×1
OBTURATOR OPTICALSTD 8 DVNC (TROCAR) ×1 IMPLANT
PACK LAP CHOLECYSTECTOMY (MISCELLANEOUS) ×2 IMPLANT
PENCIL ELECTRO HAND CTR (MISCELLANEOUS) ×2 IMPLANT
SEAL CANN UNIV 5-8 DVNC XI (MISCELLANEOUS) ×3 IMPLANT
SEAL XI 5MM-8MM UNIVERSAL (MISCELLANEOUS) ×3
SET TUBE SMOKE EVAC HIGH FLOW (TUBING) ×2 IMPLANT
SOLUTION ELECTROLUBE (MISCELLANEOUS) ×2 IMPLANT
SPIKE FLUID TRANSFER (MISCELLANEOUS) ×2 IMPLANT
SPONGE T-LAP 18X18 ~~LOC~~+RFID (SPONGE) IMPLANT
SPONGE T-LAP 4X18 ~~LOC~~+RFID (SPONGE) ×2 IMPLANT
STAPLER CANNULA SEAL DVNC XI (STAPLE) ×1 IMPLANT
STAPLER CANNULA SEAL XI (STAPLE) ×1
SUT MNCRL AB 4-0 PS2 18 (SUTURE) ×2 IMPLANT
SUT VIC AB 3-0 SH 27 (SUTURE)
SUT VIC AB 3-0 SH 27X BRD (SUTURE) IMPLANT
SUT VICRYL 0 AB UR-6 (SUTURE) ×4 IMPLANT
SYS BAG RETRIEVAL 10MM (BASKET) ×1
SYSTEM BAG RETRIEVAL 10MM (BASKET) ×1 IMPLANT
TAPE TRANSPORE STRL 2 31045 (GAUZE/BANDAGES/DRESSINGS) ×2 IMPLANT
TROCAR BALLN GELPORT 12X130M (ENDOMECHANICALS) ×2 IMPLANT
WATER STERILE IRR 500ML POUR (IV SOLUTION) ×2 IMPLANT

## 2021-11-02 NOTE — Anesthesia Preprocedure Evaluation (Addendum)
Anesthesia Evaluation  ?Patient identified by MRN, date of birth, ID band ?Patient awake ? ? ? ?Reviewed: ?Allergy & Precautions, NPO status , Patient's Chart, lab work & pertinent test results ? ?History of Anesthesia Complications ?Negative for: history of anesthetic complications ? ?Airway ?Mallampati: I ? ? ?Neck ROM: Full ? ? ? Dental ? ?(+) Missing ?  ?Pulmonary ?neg pulmonary ROS,  ?  ?Pulmonary exam normal ?breath sounds clear to auscultation ? ? ? ? ? ? Cardiovascular ?Exercise Tolerance: Good ?negative cardio ROS ?Normal cardiovascular exam ?Rhythm:Regular Rate:Normal ? ?ECG 06/08/21: normal ?  ?Neuro/Psych ? Headaches, Seizures - (as a child),  PSYCHIATRIC DISORDERS Anxiety   ? GI/Hepatic ?GERD  ,  ?Endo/Other  ?Obesity  ? Renal/GU ?negative Renal ROS  ? ?  ?Musculoskeletal ? ? Abdominal ?  ?Peds ? Hematology ?negative hematology ROS ?(+)   ?Anesthesia Other Findings ? ? Reproductive/Obstetrics ? ?  ? ? ? ? ? ? ? ? ? ? ? ? ? ?  ?  ? ? ? ? ? ? ? ?Anesthesia Physical ?Anesthesia Plan ? ?ASA: 2 ? ?Anesthesia Plan: General  ? ?Post-op Pain Management:   ? ?Induction: Intravenous ? ?PONV Risk Score and Plan: 3 and Ondansetron, Dexamethasone and Treatment may vary due to age or medical condition ? ?Airway Management Planned: Oral ETT ? ?Additional Equipment:  ? ?Intra-op Plan:  ? ?Post-operative Plan: Extubation in OR ? ?Informed Consent: I have reviewed the patients History and Physical, chart, labs and discussed the procedure including the risks, benefits and alternatives for the proposed anesthesia with the patient or authorized representative who has indicated his/her understanding and acceptance.  ? ? ? ?Dental advisory given ? ?Plan Discussed with: CRNA ? ?Anesthesia Plan Comments: (Patient consented for risks of anesthesia including but not limited to:  ?- adverse reactions to medications ?- damage to eyes, teeth, lips or other oral mucosa ?- nerve damage due to  positioning  ?- sore throat or hoarseness ?- damage to heart, brain, nerves, lungs, other parts of body or loss of life ? ?Informed patient about role of CRNA in peri- and intra-operative care.  Patient voiced understanding.)  ? ? ? ? ? ? ?Anesthesia Quick Evaluation ? ?

## 2021-11-02 NOTE — Interval H&P Note (Signed)
History and Physical Interval Note: ? ?11/02/2021 ?7:10 AM ? ?Carolyn Lynch  has presented today for surgery, with the diagnosis of Symptomatic cholelithiasis.  The various methods of treatment have been discussed with the patient and family. After consideration of risks, benefits and other options for treatment, the patient has consented to  Procedure(s): ?XI ROBOTIC ASSISTED LAPAROSCOPIC CHOLECYSTECTOMY (N/A) ?INDOCYANINE GREEN FLUORESCENCE IMAGING (ICG) (N/A) as a surgical intervention.  The patient's history has been reviewed, patient examined, no change in status, stable for surgery.  I have reviewed the patient's chart and labs.  Questions were answered to the patient's satisfaction.   ? ? ?Carolyn Lynch ? ? ?

## 2021-11-02 NOTE — Op Note (Signed)
?  Procedure Date:  11/02/2021 ? ?Pre-operative Diagnosis:  Symptomatic cholelithiasis ? ?Post-operative Diagnosis: Symptomatic cholelithiasis ? ?Procedure:  Robotic assisted cholecystectomy with ICG FireFly cholangiogram ? ?Surgeon:  Melvyn Neth, MD ? ?Anesthesia:  General endotracheal ? ?Estimated Blood Loss:  10 ml ? ?Specimens:  gallbladder ? ?Complications:  None ? ?Indications for Procedure:  This is a 36 y.o. female who presents with abdominal pain and workup revealing symptomatic cholelithiasis.  The benefits, complications, treatment options, and expected outcomes were discussed with the patient. The risks of bleeding, infection, recurrence of symptoms, failure to resolve symptoms, bile duct damage, bile duct leak, retained common bile duct stone, bowel injury, and need for further procedures were all discussed with the patient and she was willing to proceed. ? ?Description of Procedure: ?The patient was correctly identified in the preoperative area and brought into the operating room.  The patient was placed supine with VTE prophylaxis in place.  Appropriate time-outs were performed.  Anesthesia was induced and the patient was intubated.  Appropriate antibiotics were infused. ? ?The abdomen was prepped and draped in a sterile fashion. An infraumbilical incision was made. A cutdown technique was used to enter the abdominal cavity without injury, and a 12 mm robotic port was inserted.  Pneumoperitoneum was obtained with appropriate opening pressures.  Three 8-mm ports were placed in the mid abdomen at the level of the umbilicus under direct visualization.  The DaVinci platform was docked, camera targeted, and instruments were placed under direct visualization. ? ?The gallbladder was identified.  The fundus was grasped and retracted cephalad.  Adhesions were lysed bluntly and with electrocautery. The infundibulum was grasped and retracted laterally, exposing the peritoneum overlying the gallbladder.  This  was incised with electrocautery and extended on either side of the gallbladder.  FireFly cholangiogram was then obtained, and we were able to clearly identify the cystic duct and common bile duct.  The cystic duct and cystic artery were carefully dissected with combination of cautery and blunt dissection.  Both were clipped twice proximally and once distally, cutting in between.  The gallbladder was taken from the gallbladder fossa in a retrograde fashion with electrocautery. The gallbladder was placed in an Endocatch bag. The liver bed was inspected and any bleeding was controlled with electrocautery. The right upper quadrant was then inspected again revealing intact clips, no bleeding, and no ductal injury. ? ?The 8 mm ports were removed under direct visualization and the 12 mm port was removed.  The Endocatch bag was brought out via the umbilical incision. The fascial opening was closed using 0 vicryl suture.  Local anesthetic was infused in all incisions and the incisions were closed with 4-0 Monocryl.  The wounds were cleaned and sealed with DermaBond. ? ?The patient was emerged from anesthesia and extubated and brought to the recovery room for further management. ? ?The patient tolerated the procedure well and all counts were correct at the end of the case. ? ? ?Melvyn Neth, MD ? ? ?  ?

## 2021-11-02 NOTE — Anesthesia Procedure Notes (Signed)
Procedure Name: Intubation ?Date/Time: 11/02/2021 7:35 AM ?Performed by: Henrietta Hoover, CRNA ?Pre-anesthesia Checklist: Patient identified, Emergency Drugs available, Suction available and Patient being monitored ?Patient Re-evaluated:Patient Re-evaluated prior to induction ?Oxygen Delivery Method: Circle system utilized ?Preoxygenation: Pre-oxygenation with 100% oxygen ?Induction Type: IV induction and Cricoid Pressure applied ?Ventilation: Mask ventilation without difficulty ?Laryngoscope Size: 3 and McGraph ?Grade View: Grade I ?Tube type: Oral ?Tube size: 6.5 mm ?Number of attempts: 1 ?Airway Equipment and Method: Stylet and Video-laryngoscopy ?Placement Confirmation: ETT inserted through vocal cords under direct vision, positive ETCO2 and breath sounds checked- equal and bilateral ?Secured at: 21 cm ?Tube secured with: Tape ?Dental Injury: Teeth and Oropharynx as per pre-operative assessment  ? ? ? ? ?

## 2021-11-02 NOTE — Transfer of Care (Signed)
Immediate Anesthesia Transfer of Care Note ? ?Patient: Carolyn Lynch ? ?Procedure(s) Performed: XI ROBOTIC ASSISTED LAPAROSCOPIC CHOLECYSTECTOMY ?INDOCYANINE GREEN FLUORESCENCE IMAGING (ICG) ? ?Patient Location: PACU ? ?Anesthesia Type:General ? ?Level of Consciousness: awake, drowsy and patient cooperative ? ?Airway & Oxygen Therapy: Patient Spontanous Breathing ? ?Post-op Assessment: Report given to RN, Post -op Vital signs reviewed and stable and Patient moving all extremities ? ?Post vital signs: Reviewed and stable ? ?Last Vitals:  ?Vitals Value Taken Time  ?BP 135/71 11/02/21 0920  ?Temp    ?Pulse 98 11/02/21 0924  ?Resp 17 11/02/21 0919  ?SpO2 97 % 11/02/21 0924  ?Vitals shown include unvalidated device data. ? ?Last Pain:  ?Vitals:  ? 11/02/21 0634  ?PainSc: 0-No pain  ?   ? ?  ? ?Complications: No notable events documented. ?

## 2021-11-02 NOTE — Anesthesia Postprocedure Evaluation (Signed)
Anesthesia Post Note ? ?Patient: Stevie Ertle ? ?Procedure(s) Performed: XI ROBOTIC ASSISTED LAPAROSCOPIC CHOLECYSTECTOMY ?INDOCYANINE GREEN FLUORESCENCE IMAGING (ICG) ? ?Patient location during evaluation: PACU ?Anesthesia Type: General ?Level of consciousness: awake and alert, oriented and patient cooperative ?Pain management: pain level controlled ?Vital Signs Assessment: post-procedure vital signs reviewed and stable ?Respiratory status: spontaneous breathing, nonlabored ventilation and respiratory function stable ?Cardiovascular status: blood pressure returned to baseline and stable ?Postop Assessment: adequate PO intake ?Anesthetic complications: no ? ? ?No notable events documented. ? ? ?Last Vitals:  ?Vitals:  ? 11/02/21 0945 11/02/21 1000  ?BP: 128/76 120/77  ?Pulse: 94 98  ?Resp:  18  ?Temp:    ?SpO2: 97% 97%  ?  ?Last Pain:  ?Vitals:  ? 11/02/21 1000  ?PainSc: 6   ? ? ?  ?  ?  ?  ?  ?  ? ?Reed Breech ? ? ? ? ?

## 2021-11-02 NOTE — Discharge Instructions (Signed)

## 2021-11-05 LAB — SURGICAL PATHOLOGY

## 2021-11-21 ENCOUNTER — Other Ambulatory Visit: Payer: Self-pay

## 2021-11-21 ENCOUNTER — Encounter: Payer: Self-pay | Admitting: Physician Assistant

## 2021-11-21 ENCOUNTER — Ambulatory Visit (INDEPENDENT_AMBULATORY_CARE_PROVIDER_SITE_OTHER): Payer: Medicaid Other | Admitting: Physician Assistant

## 2021-11-21 VITALS — BP 110/79 | HR 88 | Temp 98.4°F | Ht 64.0 in | Wt 178.2 lb

## 2021-11-21 DIAGNOSIS — K8020 Calculus of gallbladder without cholecystitis without obstruction: Secondary | ICD-10-CM

## 2021-11-21 DIAGNOSIS — Z09 Encounter for follow-up examination after completed treatment for conditions other than malignant neoplasm: Secondary | ICD-10-CM

## 2021-11-21 NOTE — Patient Instructions (Signed)

## 2021-11-21 NOTE — Progress Notes (Signed)
[facility] POST-OP OFFICE VISIT  11/21/2021  HPI: Carolyn Lynch is a 36 y.o. female 19 days s/p robotic assisted laparoscopic cholecystectomy for symptomatic cholelithiasis with Dr Aleen Campi   She is doing well Some abdominal soreness but otherwise no pain Some intermittent diarrhea; unsure if this is related to her diet; not using any medications No fever, chills, nausea, emesis No issues with incisions No other complaints or concerns   Vital signs: BP 110/79   Pulse 88   Temp 98.4 F (36.9 C) (Oral)   Ht 5\' 4"  (1.626 m)   Wt 178 lb 3.2 oz (80.8 kg)   SpO2 98%   BMI 30.59 kg/m    Physical Exam: Constitutional: Well appearing female, NAD Abdomen: Soft, non-tender, non-distended, no rebound/guarding Skin: Laparoscopic incisions are healing well, no erythema or drainage   Assessment/Plan: This is a 36 y.o. female 19 days s/p robotic assisted laparoscopic cholecystectomy for symptomatic cholelithiasis   - Pain control prn  - PRN imodium for diarrhea   - Reviewed wound care recommendation  - Reviewed lifting restrictions; 4 weeks total  - Reviewed surgical pathology; CCC; negative for malignancy   - She can follow up on as needed basis; She understands to call with questions/concerns  -- 20, PA-C [facility] 11/21/2021, 2:49 PM M-F: 7am - 4pm

## 2022-04-02 ENCOUNTER — Encounter: Payer: Self-pay | Admitting: Obstetrics and Gynecology

## 2022-04-26 ENCOUNTER — Encounter: Payer: Medicaid Other | Admitting: Obstetrics and Gynecology

## 2022-04-26 DIAGNOSIS — Z01419 Encounter for gynecological examination (general) (routine) without abnormal findings: Secondary | ICD-10-CM

## 2022-05-30 ENCOUNTER — Ambulatory Visit: Payer: Medicaid Other | Admitting: Obstetrics and Gynecology

## 2022-05-30 DIAGNOSIS — Z01419 Encounter for gynecological examination (general) (routine) without abnormal findings: Secondary | ICD-10-CM

## 2022-07-10 ENCOUNTER — Ambulatory Visit (INDEPENDENT_AMBULATORY_CARE_PROVIDER_SITE_OTHER): Payer: Medicaid Other | Admitting: Obstetrics and Gynecology

## 2022-07-10 ENCOUNTER — Encounter: Payer: Self-pay | Admitting: Obstetrics and Gynecology

## 2022-07-10 VITALS — BP 113/79 | HR 87 | Ht 64.0 in | Wt 196.7 lb

## 2022-07-10 DIAGNOSIS — Z01419 Encounter for gynecological examination (general) (routine) without abnormal findings: Secondary | ICD-10-CM | POA: Diagnosis not present

## 2022-07-10 NOTE — Progress Notes (Signed)
HPI:      [name] is a 37 y.o. G1P1001 who LMP was Patient's last menstrual period was 06/24/2022 (approximate).  Subjective:   She presents today for her annual examination.  She has no complaints.  She reports that the cyst in her breast and a cyst in her pelvis seem to have resolved.  She is having no issues.  She has normal regular cycles.  She is not using anything for birth control at this time but does not want birth control.  She is sexually active and states that if she gets pregnant, if it happens it happens.    Hx: The following portions of the patient's history were reviewed and updated as appropriate:             She  has a past medical history of Anxiety, GERD (gastroesophageal reflux disease), Headache, Seizure ([place]), and Vitamin D deficiency. She does not have any pertinent problems on file. She  has a past surgical history that includes Wisdom tooth extraction. Her family history includes Cancer in her paternal grandfather and paternal grandmother; Diabetes in her mother; Hyperlipidemia in her mother; Hypertension in her father; Stroke in her mother. She  reports that she has never smoked. She has never used smokeless tobacco. She reports that she does not drink alcohol and does not use drugs. She has a current medication list which includes the following prescription(s): acetaminophen, ibuprofen, and nystatin. She has No Known Allergies.       Review of Systems:  Review of Systems  Constitutional: Denied constitutional symptoms, night sweats, recent illness, fatigue, fever, insomnia and weight loss.  Eyes: Denied eye symptoms, eye pain, photophobia, vision change and visual disturbance.  Ears/Nose/Throat/Neck: Denied ear, nose, throat or neck symptoms, hearing loss, nasal discharge, sinus congestion and sore throat.  Cardiovascular: Denied cardiovascular symptoms, arrhythmia, chest pain/pressure, edema, exercise intolerance, orthopnea and palpitations.  Respiratory:  Denied pulmonary symptoms, asthma, pleuritic pain, productive sputum, cough, dyspnea and wheezing.  Gastrointestinal: Denied, gastro-esophageal reflux, melena, nausea and vomiting.  Genitourinary: Denied genitourinary symptoms including symptomatic vaginal discharge, pelvic relaxation issues, and urinary complaints.  Musculoskeletal: Denied musculoskeletal symptoms, stiffness, swelling, muscle weakness and myalgia.  Dermatologic: Denied dermatology symptoms, rash and scar.  Neurologic: Denied neurology symptoms, dizziness, headache, neck pain and syncope.  Psychiatric: Denied psychiatric symptoms, anxiety and depression.  Endocrine: Denied endocrine symptoms including hot flashes and night sweats.   Meds:   Current Outpatient Medications on File Prior to Visit  Medication Sig Dispense Refill   acetaminophen (TYLENOL) 500 MG tablet Take 2 tablets (1,000 mg total) by mouth every 6 (six) hours as needed for mild pain.     ibuprofen (ADVIL) 800 MG tablet Take 1 tablet (800 mg total) by mouth every 8 (eight) hours as needed for moderate pain. 60 tablet 1   nystatin (MYCOSTATIN/NYSTOP) powder Apply 1 application. topically 4 (four) times daily as needed (irritation).     No current facility-administered medications on file prior to visit.     Objective:     Vitals:   07/10/22 0910  BP: 113/79  Pulse: 87    Filed Weights   07/10/22 0910  Weight: 196 lb 11.2 oz (89.2 kg)              Physical examination General NAD, Conversant  HEENT Atraumatic; Op clear with mmm.  Normo-cephalic. Pupils reactive. Anicteric sclerae  Thyroid/Neck Smooth without nodularity or enlargement. Normal ROM.  Neck Supple.  Skin No rashes, lesions or ulceration. Normal  palpated skin turgor. No nodularity.  Breasts: No masses or discharge.  Symmetric.  No axillary adenopathy.  Lungs: Clear to auscultation.No rales or wheezes. Normal Respiratory effort, no retractions.  Heart: NSR.  No murmurs or rubs  appreciated. No peripheral edema  Abdomen: Soft.  Non-tender.  No masses.  No HSM. No hernia  Extremities: Moves all appropriately.  Normal ROM for age. No lymphadenopathy.  Neuro: Oriented to PPT.  Normal mood. Normal affect.     Pelvic:   Vulva: Normal appearance.  No lesions.  Vagina: No lesions or abnormalities noted.  Support: Normal pelvic support.  Urethra No masses tenderness or scarring.  Meatus Normal size without lesions or prolapse.  Cervix: Normal appearance.  No lesions.  Anus: Normal exam.  No lesions.  Perineum: Normal exam.  No lesions.        Bimanual   Uterus: Normal size.  Non-tender.  Mobile.  AV.  Adnexae: No masses.  Non-tender to palpation.  Cul-de-sac: Negative for abnormality.     Assessment:    G1P1001 Patient Active Problem List   Diagnosis Date Noted   Symptomatic cholelithiasis    Post-dates pregnancy 07/12/2017   Term pregnancy 07/12/2017   Encounter for supervision of normal first pregnancy in third trimester 03/27/2017   Hemorrhoids in pregnancy in second trimester 03/27/2017     1. Well woman exam with routine gynecological exam     Normal exam   Plan:            1.  Basic Screening Recommendations The basic screening recommendations for asymptomatic women were discussed with the patient during her visit.  The age-appropriate recommendations were discussed with her and the rational for the tests reviewed.  When I am informed by the patient that another primary care physician has previously obtained the age-appropriate tests and they are up-to-date, only outstanding tests are ordered and referrals given as necessary.  Abnormal results of tests will be discussed with her when all of her results are completed.  Routine preventative health maintenance measures emphasized: Exercise/Diet/Weight control, Tobacco Warnings, Alcohol/Substance use risks and Stress Management  Orders No orders of the defined types were placed in this  encounter.   No orders of the defined types were placed in this encounter.         F/U  Return in about 1 year (around 07/11/2023) for Annual Physical.  Finis Bud, M.D. 07/10/2022 9:36 AM

## 2022-07-10 NOTE — Progress Notes (Signed)
Patients presents for annual exam today. She states having regular menstrual cycles. Patient is up to date on pap smear. Annual labs are deferred. Patient states no other questions or concerns at this time.

## 2022-08-08 ENCOUNTER — Encounter: Payer: Self-pay | Admitting: Obstetrics and Gynecology

## 2022-10-11 ENCOUNTER — Ambulatory Visit
Admission: RE | Admit: 2022-10-11 | Discharge: 2022-10-11 | Disposition: A | Discharge status: Home / Self Care | Payer: Medicaid Other | Source: Ambulatory Visit | Attending: Chiropractor | Admitting: Chiropractor

## 2022-10-11 ENCOUNTER — Ambulatory Visit
Admission: RE | Admit: 2022-10-11 | Discharge: 2022-10-11 | Disposition: A | Discharge status: Home / Self Care | Payer: Medicaid Other | Attending: Chiropractor | Admitting: Chiropractor

## 2022-10-11 ENCOUNTER — Other Ambulatory Visit: Payer: Self-pay | Admitting: Chiropractor

## 2022-10-11 DIAGNOSIS — S39012A Strain of muscle, fascia and tendon of lower back, initial encounter: Secondary | ICD-10-CM | POA: Diagnosis present

## 2024-07-02 ENCOUNTER — Other Ambulatory Visit: Payer: Self-pay | Admitting: Family Medicine

## 2024-07-02 DIAGNOSIS — R195 Other fecal abnormalities: Secondary | ICD-10-CM
